# Patient Record
Sex: Male | Born: 1995 | Race: Black or African American | Hispanic: No | Marital: Single | Smoking: Never smoker
Health system: Southern US, Community
[De-identification: ages and names within clinical notes are randomized; demographics above are authoritative.]

## PROBLEM LIST (undated history)

## (undated) DIAGNOSIS — J45909 Unspecified asthma, uncomplicated: Secondary | ICD-10-CM

## (undated) DIAGNOSIS — T7840XA Allergy, unspecified, initial encounter: Secondary | ICD-10-CM

## (undated) HISTORY — DX: Allergy, unspecified, initial encounter: T78.40XA

## (undated) HISTORY — PX: NO PAST SURGERIES: SHX2092

---
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
## 2017-01-23 ENCOUNTER — Ambulatory Visit: Payer: Managed Care, Other (non HMO) | Admitting: Physician Assistant

## 2017-01-23 ENCOUNTER — Encounter: Payer: Self-pay | Admitting: Physician Assistant

## 2017-01-23 ENCOUNTER — Telehealth: Payer: Self-pay

## 2017-01-23 ENCOUNTER — Other Ambulatory Visit: Payer: Self-pay

## 2017-01-23 VITALS — BP 128/80 | HR 91 | Temp 98.1°F | Resp 18 | Ht 70.0 in | Wt 181.0 lb

## 2017-01-23 DIAGNOSIS — J069 Acute upper respiratory infection, unspecified: Secondary | ICD-10-CM

## 2017-01-23 DIAGNOSIS — Z91011 Allergy to milk products: Secondary | ICD-10-CM

## 2017-01-23 MED ORDER — HYDROCOD POLST-CPM POLST ER 10-8 MG/5ML PO SUER: 5.0000 mL | Freq: Every evening | ORAL | 0 refills | Status: DC | PRN

## 2017-01-23 MED ORDER — EPINEPHRINE 0.3 MG/0.3ML IJ SOAJ: 0.3000 mg | Freq: Once | INTRAMUSCULAR | 99 refills | Status: DC

## 2017-01-23 MED ORDER — BENZONATATE 100 MG PO CAPS: 100.0000 mg | ORAL_CAPSULE | Freq: Three times a day (TID) | ORAL | 0 refills | Status: DC | PRN

## 2017-01-23 MED ORDER — AZELASTINE HCL 0.1 % NA SOLN: 2.0000 | Freq: Two times a day (BID) | NASAL | 0 refills | Status: DC

## 2017-01-23 NOTE — Telephone Encounter (Signed)
Called in rx for epipen.

## 2017-01-23 NOTE — Patient Instructions (Addendum)
Get plenty of SLEEP! Drink lots of WATER!    IF you received an x-ray today, you will receive an [facility] from [facility]. Please contact [facility] at [phone] with questions or concerns regarding your invoice.   IF you received labwork today, you will receive an invoice from [facility]. Please contact [facility] at [phone] with questions or concerns regarding your invoice.   Our billing staff will not be able to assist you with questions regarding bills from these companies.  You will be contacted with the lab results as soon as they are available. The fastest way to get your results is to activate your My Chart account. Instructions are located on the last page of this paperwork. If you have not heard from us regarding the results in 2 weeks, please contact this office.

## 2017-01-23 NOTE — Progress Notes (Signed)
Patient ID: Bradley Maynard, male    DOB: Jan 30, 1996, 21 y.o.   MRN: [id number]  PCP: Patient, No Pcp Per  Chief Complaint  Patient presents with  . Cough    x2 weeks, pt states Bradley Maynard has a cough and runny nose and headaches. Pt states his chest hurts from coughing so much.  Pt states Bradley Maynard has been taking OTC Mucinex and NiQuil. Pt states Bradley Maynard took the Niquil early this mornuing.    Subjective:   Presents for evaluation of cough x 2 weeks. Bradley Maynard is accompanied by his mother.  His symptoms began with runny nose headache chest pain with coughing.  Cough produces clear phlegm.  Chest and head pain rated 6 out of 10 at worst. 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  Works 5 days a week at one job and 3 at the other.  Very long days.  His mom notes that Bradley Maynard prioritizes sleep during his off hours.  OTC Mucinex and NyQuil have helped but not alleviated his symptoms.  Bradley Maynard needs a new prescription for EpiPen due to milk/dairy allergy.    Review of Systems Constitutional: Positive for fever (tactile). Negative for appetite change.  HENT: Positive for congestion and rhinorrhea. Negative for ear pain, sinus pressure, sinus pain and sore throat.   Respiratory: Positive for cough and shortness of breath (with coughing).   Cardiovascular: Positive for chest pain (with coughing).  Gastrointestinal: Negative for abdominal pain, constipation, diarrhea, nausea and vomiting.  Genitourinary: Negative for dysuria.  Musculoskeletal: Positive for myalgias (shoulder pain with working).  Neurological: Positive for light-headedness (with lifting heavy objects at work. This past week only) and headaches.       There are no active problems to display for this patient.    Prior to Admission medications   Medication Sig Start Date End Date Taking? Authorizing Provider  EPINEPHrine (EPIPEN) 0.3 mg/0.3 mL IJ SOAJ injection Inject 0.3 mLs (0.3 mg total) into the muscle once. 09/13/13  Yes Tonye Pearsonoolittle,  Robert P, MD     Allergies  Allergen Reactions  . Milk-Related Compounds Anaphylaxis       Objective:  Physical Exam  Constitutional: Bradley Maynard is oriented to person, place, and time. Bradley Maynard appears well-developed and well-nourished. Bradley Maynard is active and cooperative. No distress.  BP 128/80 (BP Location: Right Arm, Patient Position: Sitting, Cuff Size: Normal)   Pulse 91   Temp 98.1 F (36.7 C) (Oral)   Resp 18   Ht 5\' 10"  (1.778 m)   Wt 181 lb (82.1 kg)   SpO2 98%   BMI 25.97 kg/m   HENT:  Head: Normocephalic and atraumatic.  Right Ear: Hearing and external ear normal.  Left Ear: Hearing and external ear normal.  Nose: Rhinorrhea (clear) present. No mucosal edema. Right sinus exhibits no maxillary sinus tenderness and no frontal sinus tenderness. Left sinus exhibits no maxillary sinus tenderness and no frontal sinus tenderness.  Mouth/Throat: Oropharynx is clear and moist. No oropharyngeal exudate.  Eyes: Conjunctivae and EOM are normal. Pupils are equal, round, and reactive to light. No scleral icterus.  Neck: Normal range of motion. Neck supple. No thyromegaly present.  Cardiovascular: Normal rate, regular rhythm and normal heart sounds.  Pulses:      Radial pulses are 2+ on the right side, and 2+ on the left side.  Pulmonary/Chest: Effort normal and breath sounds normal.  Lymphadenopathy:       Head (right side): No tonsillar, no preauricular, no  posterior auricular and no occipital adenopathy present.       Head (left side): No tonsillar, no preauricular, no posterior auricular and no occipital adenopathy present.    Bradley Maynard has no cervical adenopathy.       Right: No supraclavicular adenopathy present.       Left: No supraclavicular adenopathy present.  Neurological: Bradley Maynard is alert and oriented to person, place, and time. No sensory deficit.  Skin: Skin is warm, dry and intact. No rash noted. No cyanosis or erythema. Nails show no clubbing.  Psychiatric: Bradley Maynard has a normal mood and affect. His  speech is normal and behavior is normal.           Assessment & Plan:   1. Acute upper respiratory infection Likely viral.  Supportive care.  Rest, hydrate, OTC NSAIDs. - azelastine (ASTELIN) 0.1 % nasal spray; Place 2 sprays into both nostrils 2 (two) times daily. Use in each nostril as directed  Dispense: 30 mL; Refill: 0 - chlorpheniramine-HYDROcodone (TUSSIONEX PENNKINETIC ER) 10-8 MG/5ML SUER; Take 5 mLs by mouth at bedtime as needed for cough.  Dispense: 60 mL; Refill: 0 - benzonatate (TESSALON) 100 MG capsule; Take 1-2 capsules (100-200 mg total) by mouth 3 (three) times daily as needed for cough.  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    Return if symptoms worsen or fail to improve.   Fernande Brashelle S. Anntoinette Haefele, PA-C [facility] at [facility]

## 2017-01-23 NOTE — Progress Notes (Signed)
Chief Complaint  Patient presents with  . Cough    x2 weeks, pt states he has a cough and runny nose and headaches. Pt states his chest hurts from coughing so much.  Pt states he has been taking OTC Mucinex and NiQuil. Pt states he took the Niquil early this mornuing.   Subjective:    Patient ID: Bradley Maynard, male    DOB: 1995-08-07, 21 y.o.   MRN: [id number]  HPI Mr. Bradley Maynard is a 21 year old male that presents for evaluation of upper respiratory symptoms that have been going going on for two weeks.  Patient has a cough, runny nose, chest pain with coughing and headaches. Patient has clear phlegm with coughing. 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 Pain is 6/10 at its worst and is throughout his forehead. Mucous from nose is clear as well.  His coworkers have been having similar symptoms. He has tried taking OTC Mucinex and NyQuil which have been helping but haven't cleared his symptoms.  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  Review of Systems  Constitutional: Positive for fever (tactile). Negative for appetite change.  HENT: Positive for congestion and rhinorrhea. Negative for ear pain, sinus pressure, sinus pain and sore throat.   Respiratory: Positive for cough and shortness of breath (with coughing).   Cardiovascular: Positive for chest pain (with coughing).  Gastrointestinal: Negative for abdominal pain, constipation, diarrhea, nausea and vomiting.  Genitourinary: Negative for dysuria.  Musculoskeletal: Positive for myalgias (shoulder pain with working).  Neurological: Positive for light-headedness (with lifting heavy objects at work. This past week only) and headaches.   Past Medical History:  Diagnosis Date  . Allergy    No current outpatient medications on file prior to visit.   No current facility-administered medications on file prior to visit.     Allergies  Allergen Reactions  . Milk-Related Compounds Anaphylaxis   Social History   Socioeconomic History  . Marital status: Single    Spouse name: Not on file  . Number of children: Not on file  . Years of education: Not on file  . Highest education level: Not on file  Social Needs  . Financial resource strain: Not on file  . Food insecurity - worry: Not on file  . Food insecurity - inability: Not on file  . Transportation needs - medical: Not on file  . Transportation needs - non-medical: Not on file  Occupational History  . Not on file  Tobacco Use  . Smoking status: Never Smoker  . Smokeless tobacco: Never Used  Substance and Sexual Activity  . Alcohol use: No  . Drug use: No  . Sexual activity: Not on file  Other Topics Concern  . Not on file  Social History Narrative  . Not on file      Objective:   Physical Exam  Constitutional: He appears well-developed and well-nourished. No distress.  HENT:  Head: Normocephalic and atraumatic.  Right Ear: External ear normal.  Left Ear: External ear normal.  Nose: Nose normal.  Mouth/Throat: Oropharynx is clear and moist. No oropharyngeal exudate.  Clear mucus present in nose  Eyes: Conjunctivae are normal.  Neck: Neck supple.  Cardiovascular: Normal rate, regular rhythm and normal heart sounds.  Pulmonary/Chest: Effort normal and breath sounds normal. No respiratory distress.  Lymphadenopathy:    He has no cervical adenopathy.  Neurological: He is alert.  Skin: Skin is warm.  Psychiatric: He has a normal mood and affect.      Assessment & Plan:  1. Acute upper respiratory infection Discussed importance of sleep and hydration. - azelastine (ASTELIN) 0.1 % nasal spray; Place 2 sprays into both nostrils 2 (two) times daily. Use in each nostril as directed  Dispense: 30 mL; Refill: 0 - chlorpheniramine-HYDROcodone (TUSSIONEX PENNKINETIC ER) 10-8 MG/5ML SUER; Take 5 mLs by mouth at bedtime as needed for cough.   Dispense: 60 mL; Refill: 0 - benzonatate (TESSALON) 100 MG capsule; Take 1-2 capsules (100-200 mg total) by mouth 3 (three) times daily as needed for cough.  Dispense: 40 capsule; Refill: 0  2. Milk allergy Epipen re-ordered per patient request. - EPINEPHrine (EPIPEN) 0.3 mg/0.3 mL IJ SOAJ injection; Inject 0.3 mLs (0.3 mg total) into the muscle once for 1 dose.  Dispense: 2 Device; Refill: PRN  Cherylann RatelFaithcaren W Diyana Starrett, Student-PA

## 2017-01-25 ENCOUNTER — Telehealth: Payer: Self-pay | Admitting: Physician Assistant

## 2017-01-25 DIAGNOSIS — J069 Acute upper respiratory infection, unspecified: Secondary | ICD-10-CM

## 2017-01-25 NOTE — Telephone Encounter (Signed)
Please advise 

## 2017-01-25 NOTE — Telephone Encounter (Signed)
Copied from CRM [phone]. Topic: Quick Communication - See Telephone Encounter >> Jan 25, 2017  9:24 AM Rudi CocoLathan, Rayneisha Bouza M, NT wrote: CRM for notification. See Telephone encounter for:   01/25/17. Pt. Mother called Patriciaann Clan(Monica [facility]adi) and said pharmacy did not give pt. The entire amount of cough syrup and pt. Is already out and needs more. Calling to see if he can get more. Pt. Still in pain with sore throat. (chlorpheniramine Hydrocodone)  [facility] [address] - [place] ([place]), [place] - [address] [place]) [place] [id number] Phone: [phone] Fax: [phone] Not a 24 hour pharmacy; exact hours not known

## 2017-01-25 NOTE — Telephone Encounter (Signed)
Please call the pharmacy. How much did they dispense?

## 2017-01-30 MED ORDER — HYDROCOD POLST-CPM POLST ER 10-8 MG/5ML PO SUER: 5.0000 mL | Freq: Every evening | ORAL | 0 refills | Status: DC | PRN

## 2017-01-30 NOTE — Telephone Encounter (Signed)
Rx sent electronically.  Meds ordered this encounter  Medications  . chlorpheniramine-HYDROcodone (TUSSIONEX PENNKINETIC ER) 10-8 MG/5ML SUER    Sig: Take 5 mLs by mouth at bedtime as needed for cough.    Dispense:  35 mL    Refill:  0    Order Specific Question:   Supervising Provider    Answer:   Clelia CroftSHAW, EVA N [[id number]]

## 2017-01-30 NOTE — Telephone Encounter (Signed)
[facility] states they're going by new guidelines and the pt was only given 35 ml because it is hydrocodone. [facility] states that the new guidelines states pts can only get 7 days worth at a time. We can send a new Rx.

## 2017-01-30 NOTE — Telephone Encounter (Signed)
Pt mom is aware waiting on someone to contact pharm

## 2017-02-04 ENCOUNTER — Ambulatory Visit: Payer: Managed Care, Other (non HMO) | Admitting: Family Medicine

## 2017-02-04 ENCOUNTER — Other Ambulatory Visit: Payer: Self-pay

## 2017-02-04 ENCOUNTER — Encounter: Payer: Self-pay | Admitting: Family Medicine

## 2017-02-04 VITALS — BP 112/78 | HR 111 | Temp 98.7°F | Resp 16 | Ht 70.0 in | Wt 181.8 lb

## 2017-02-04 DIAGNOSIS — J208 Acute bronchitis due to other specified organisms: Secondary | ICD-10-CM | POA: Diagnosis not present

## 2017-02-04 DIAGNOSIS — J069 Acute upper respiratory infection, unspecified: Secondary | ICD-10-CM

## 2017-02-04 MED ORDER — PREDNISONE 10 MG PO TABS: ORAL_TABLET | ORAL | 0 refills | Status: AC

## 2017-02-04 MED ORDER — AZITHROMYCIN 250 MG PO TABS: ORAL_TABLET | ORAL | 0 refills | Status: DC

## 2017-02-04 MED ORDER — ALBUTEROL SULFATE HFA 108 (90 BASE) MCG/ACT IN AERS: 2.0000 | INHALATION_SPRAY | Freq: Four times a day (QID) | RESPIRATORY_TRACT | 0 refills | Status: DC | PRN

## 2017-02-04 NOTE — Patient Instructions (Addendum)

## 2017-02-04 NOTE — Progress Notes (Signed)
Chief Complaint  Patient presents with  . Establish Care    cough x 2 weeks, taking cough syrup given but still has cough, cough a little better than before    HPI   Pt has been coughing for 2 weeks He has been taking cough syrups but is still coughing though his cough has improved compared to previous He was seen on 01/23/17 He is back because now he is wheezing with shortness of breath He denies fevers currently He has been feeling feverish but not in the past 2-3 days He is home from college on break He started getting sick after finals He denies nausea vomiting or diarrhea  4 review of systems  Past Medical History:  Diagnosis Date  . Allergy     Current Outpatient Medications  Medication Sig Dispense Refill  . azelastine (ASTELIN) 0.1 % nasal spray Place 2 sprays into both nostrils 2 (two) times daily. Use in each nostril as directed 30 mL 0  . benzonatate (TESSALON) 100 MG capsule Take 1-2 capsules (100-200 mg total) by mouth 3 (three) times daily as needed for cough. 40 capsule 0  . chlorpheniramine-HYDROcodone (TUSSIONEX PENNKINETIC ER) 10-8 MG/5ML SUER Take 5 mLs by mouth at bedtime as needed for cough. 35 mL 0  . albuterol (PROVENTIL HFA;VENTOLIN HFA) 108 (90 Base) MCG/ACT inhaler Inhale 2 puffs into the lungs every 6 (six) hours as needed for wheezing. 1 Inhaler 0  . azithromycin (ZITHROMAX) 250 MG tablet Take 2 tabs on day one then 1 tablet each day after 6 tablet 0  . EPINEPHrine (EPIPEN) 0.3 mg/0.3 mL IJ SOAJ injection Inject 0.3 mLs (0.3 mg total) into the muscle once for 1 dose. 2 Device PRN  . predniSONE (DELTASONE) 10 MG tablet Take 6 tablets (60 mg total) by mouth daily with breakfast for 2 days, THEN 5 tablets (50 mg total) daily with breakfast for 2 days, THEN 4 tablets (40 mg total) daily with breakfast for 2 days, THEN 3 tablets (30 mg total) daily with breakfast for 2 days, THEN 2 tablets (20 mg total) daily with breakfast for 2 days, THEN 1 tablet (10 mg  total) daily with breakfast for 2 days. 42 tablet 0   No current facility-administered medications for this visit.     Allergies:  Allergies  Allergen Reactions  . Milk-Related Compounds Anaphylaxis    No past surgical history on file.  Social History   Socioeconomic History  . Marital status: Single    Spouse name: None  . Number of children: None  . Years of education: None  . Highest education level: None  Social Needs  . Financial resource strain: None  . Food insecurity - worry: None  . Food insecurity - inability: None  . Transportation needs - medical: None  . Transportation needs - non-medical: None  Occupational History  . None  Tobacco Use  . Smoking status: Never Smoker  . Smokeless tobacco: Never Used  Substance and Sexual Activity  . Alcohol use: No  . Drug use: No  . Sexual activity: None  Other Topics Concern  . None  Social History Narrative  . None    Family History  Problem Relation Age of Onset  . Diabetes Mother   . Hypertension Mother   . Hypertension Father      ROS Review of Systems See HPI Constitution: +fevers No malaise No diaphoresis Skin: No rash or itching Eyes: no blurry vision, no double vision GU: no dysuria or hematuria Neuro: no  dizziness or headaches all others reviewed and negative   Objective: Vitals:   02/04/17 1203  BP: 112/78  Pulse: (!) 111  Resp: 16  Temp: 98.7 F (37.1 C)  TempSrc: Oral  SpO2: 96%  Weight: 181 lb 12.8 oz (82.5 kg)  Height: 5\' 10"  (1.778 m)    Physical Exam General: alert, oriented, in NAD Head: normocephalic, atraumatic, no sinus tenderness Eyes: EOM intact, no scleral icterus or conjunctival injection Ears: TM clear bilaterally Nose: mucosa nonerythematous, nonedematous Throat: no pharyngeal exudate or erythema Lymph: no posterior auricular, submental or cervical lymph adenopathy Heart: normal rate, normal sinus rhythm, no murmurs Lungs: wheezing in anterior lung  fields   Assessment and Plan Kowen was seen today for establish care.  Diagnoses and all orders for this visit:  Acute upper respiratory infection Acute bronchitis due to other specified organisms- with deterioration of symptoms Advised antibiotic, albuterol and prednisone Pt to follow up for reassessment if he is still wheezing He is here with his mother who agrees with the plan  Other orders -     Cancel: Tdap vaccine greater than or equal to 7yo IM -     predniSONE (DELTASONE) 10 MG tablet; Take 6 tablets (60 mg total) by mouth daily with breakfast for 2 days, THEN 5 tablets (50 mg total) daily with breakfast for 2 days, THEN 4 tablets (40 mg total) daily with breakfast for 2 days, THEN 3 tablets (30 mg total) daily with breakfast for 2 days, THEN 2 tablets (20 mg total) daily with breakfast for 2 days, THEN 1 tablet (10 mg total) daily with breakfast for 2 days. -     albuterol (PROVENTIL HFA;VENTOLIN HFA) 108 (90 Base) MCG/ACT inhaler; Inhale 2 puffs into the lungs every 6 (six) hours as needed for wheezing. -     azithromycin (ZITHROMAX) 250 MG tablet; Take 2 tabs on day one then 1 tablet each day after     Lariya Kinzie A Creta LevinStallings

## 2018-01-11 ENCOUNTER — Ambulatory Visit: Payer: Managed Care, Other (non HMO) | Admitting: Family Medicine

## 2018-01-13 ENCOUNTER — Ambulatory Visit: Payer: Self-pay | Admitting: Physician Assistant

## 2018-02-10 ENCOUNTER — Encounter

## 2018-02-10 ENCOUNTER — Ambulatory Visit (INDEPENDENT_AMBULATORY_CARE_PROVIDER_SITE_OTHER): Payer: Managed Care, Other (non HMO) | Admitting: Family Medicine

## 2018-02-10 ENCOUNTER — Encounter: Payer: Self-pay | Admitting: Family Medicine

## 2018-02-10 ENCOUNTER — Ambulatory Visit
Admission: RE | Admit: 2018-02-10 | Discharge: 2018-02-10 | Disposition: A | Discharge status: Home / Self Care | Payer: Managed Care, Other (non HMO) | Source: Ambulatory Visit | Attending: Family Medicine | Admitting: Family Medicine

## 2018-02-10 ENCOUNTER — Other Ambulatory Visit: Payer: Self-pay

## 2018-02-10 VITALS — BP 140/70 | HR 72 | Temp 98.7°F | Resp 16 | Ht 69.0 in | Wt 188.2 lb

## 2018-02-10 DIAGNOSIS — Z91011 Allergy to milk products: Secondary | ICD-10-CM | POA: Diagnosis not present

## 2018-02-10 DIAGNOSIS — R05 Cough: Secondary | ICD-10-CM

## 2018-02-10 DIAGNOSIS — J309 Allergic rhinitis, unspecified: Secondary | ICD-10-CM | POA: Diagnosis not present

## 2018-02-10 DIAGNOSIS — R059 Cough, unspecified: Secondary | ICD-10-CM

## 2018-02-10 DIAGNOSIS — R062 Wheezing: Secondary | ICD-10-CM | POA: Diagnosis not present

## 2018-02-10 MED ORDER — EPINEPHRINE 0.3 MG/0.3ML IJ SOAJ: 0.3000 mg | Freq: Once | INTRAMUSCULAR | 99 refills | Status: DC

## 2018-02-10 MED ORDER — GUAIFENESIN ER 600 MG PO TB12: 600.0000 mg | ORAL_TABLET | Freq: Two times a day (BID) | ORAL | 11 refills | Status: DC
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
## 2018-02-10 NOTE — Patient Instructions (Addendum)
Chest XRAY today  Use a respirator mask for 3 day at work to see if there is less congestion Layer up to be able to transition from outdoor environment to inside environment   [facility] [phone]   If you have lab work done today you will be contacted with your lab results within the next 2 weeks.  If you have not heard from us then please contact us. The fastest way to get your results is to register for My Chart.   IF you received an x-ray today, you will receive an invoice from [facility]. Please contact [facility] at [phone] with questions or concerns regarding your invoice.   IF you received labwork today, you will receive an invoice from [facility]. Please contact [facility] at [phone] with questions or concerns regarding your invoice.   Our billing staff will not be able to assist you with questions regarding bills from these companies.  You will be contacted with the lab results as soon as they are available. The fastest way to get your results is to activate your My Chart account. Instructions are located on the last page of this paperwork. If you have not heard from us regarding the results in 2 weeks, please contact this office.     Allergic Rhinitis, Adult Allergic rhinitis is an allergic reaction that affects the mucous membrane inside the nose. It causes sneezing, a runny or stuffy nose, and the feeling of mucus going down the back of the throat (postnasal drip). Allergic rhinitis can be mild to severe. There are two types of allergic rhinitis:  Seasonal. This type is also called hay fever. It happens only during certain seasons.  Perennial. This type can happen at any time of the year. What are the causes? This condition happens when the body's defense system (immune system) responds to certain harmless substances called allergens as though they were germs.  Seasonal allergic rhinitis is triggered by pollen, which can come from  grasses, trees, and weeds. Perennial allergic rhinitis may be caused by:  House dust mites.  Pet dander.  Mold spores. What are the signs or symptoms? Symptoms of this condition include:  Sneezing.  Runny or stuffy nose (nasal congestion).  Postnasal drip.  Itchy nose.  Tearing of the eyes.  Trouble sleeping.  Daytime sleepiness. How is this diagnosed? This condition may be diagnosed based on:  Your medical history.  A physical exam.  Tests to check for related conditions, such as: ? Asthma. ? Pink eye. ? Ear infection. ? Upper respiratory infection.  Tests to find out which allergens trigger your symptoms. These may include skin or blood tests. How is this treated? There is no cure for this condition, but treatment can help control symptoms. Treatment may include:  Taking medicines that block allergy symptoms, such as antihistamines. Medicine may be given as a shot, nasal spray, or pill.  Avoiding the allergen.  Desensitization. This treatment involves getting ongoing shots until your body becomes less sensitive to the allergen. This treatment may be done if other treatments do not help.  If taking medicine and avoiding the allergen does not work, new, stronger medicines may be prescribed. Follow these instructions at home:  Find out what you are allergic to. Common allergens include smoke, dust, and pollen.  Avoid the things you are allergic to. These are some things you can do to help avoid allergens: ? Replace carpet with wood, tile, or vinyl flooring. Carpet can trap dander and dust. ? Do  not smoke. Do not allow smoking in your home. ? Change your heating and air conditioning filter at least once a month. ? During allergy season:  Keep windows closed as much as possible.  Plan outdoor activities when pollen counts are lowest. This is usually during the evening hours.  When coming indoors, change clothing and shower before sitting on furniture or  bedding.  Take over-the-counter and prescription medicines only as told by your health care provider.  Keep all follow-up visits as told by your health care provider. This is important. Contact a health care provider if:  You have a fever.  You develop a persistent cough.  You make whistling sounds when you breathe (you wheeze).  Your symptoms interfere with your normal daily activities. Get help right away if:  You have shortness of breath. Summary  This condition can be managed by taking medicines as directed and avoiding allergens.  Contact your health care provider if you develop a persistent cough or fever.  During allergy season, keep windows closed as much as possible. This information is not intended to replace advice given to you by your health care provider. Make sure you discuss any questions you have with your health care provider. Document Released: 10/31/2000 Document Revised: 03/15/2016 Document Reviewed: 03/15/2016 Elsevier Interactive Patient Education  2019 [employer]

## 2018-06-05 ENCOUNTER — Ambulatory Visit (INDEPENDENT_AMBULATORY_CARE_PROVIDER_SITE_OTHER): Payer: Managed Care, Other (non HMO)

## 2018-06-05 ENCOUNTER — Ambulatory Visit
Admission: EM | Admit: 2018-06-05 | Discharge: 2018-06-05 | Disposition: A | Discharge status: Home / Self Care | Payer: Managed Care, Other (non HMO) | Attending: Family Medicine | Admitting: Family Medicine

## 2018-06-05 ENCOUNTER — Other Ambulatory Visit: Payer: Self-pay

## 2018-06-05 DIAGNOSIS — R05 Cough: Secondary | ICD-10-CM

## 2018-06-05 DIAGNOSIS — R0602 Shortness of breath: Secondary | ICD-10-CM

## 2018-06-05 DIAGNOSIS — J4521 Mild intermittent asthma with (acute) exacerbation: Secondary | ICD-10-CM | POA: Diagnosis not present

## 2018-06-05 HISTORY — DX: Unspecified asthma, uncomplicated: J45.909

## 2018-06-05 MED ORDER — ALBUTEROL SULFATE HFA 108 (90 BASE) MCG/ACT IN AERS: 2.0000 | INHALATION_SPRAY | Freq: Four times a day (QID) | RESPIRATORY_TRACT | 3 refills | Status: DC | PRN

## 2018-06-05 MED ORDER — PREDNISONE 50 MG PO TABS: ORAL_TABLET | ORAL | 0 refills | Status: DC

## 2018-06-05 NOTE — ED Provider Notes (Signed)
[facility]  CSN: [id number] Arrival date & time: 06/05/18  [id number]  History   Chief Complaint Chief Complaint  Patient presents with  . Asthma   HPI  23 year old male presents with cough and shortness of breath.  Patient reports a one-month history of cough.  Mostly dry but does produce some sputum intermittently.  Clear sputum.  No documented fever at home.  Patient reports that he feels short of breath particular with exertion.  Patient states that he has asthma and does not have a current prescription for albuterol.  His symptoms have persisted and have not improved over the past month.  He thinks that this may be exacerbated by his job.  He works in a [employer]house at [employer].  He states it is very dusty.  Reports some associated chest tightness, 5/10 in severity.  No other associated symptoms.  No other complaints or concerns at this time.  PMH, Surgical Hx, Family Hx, Social History reviewed and updated as below.  Past Medical History:  Diagnosis Date  . Allergy   . Asthma    Past Surgical History:  Procedure Laterality Date  . NO PAST SURGERIES     Home Medications    Prior to Admission medications   Medication Sig Start Date End Date Taking? Authorizing Provider  EPINEPHrine (EPIPEN) 0.3 mg/0.3 mL IJ SOAJ injection Inject 0.3 mLs (0.3 mg total) into the muscle once for 1 dose. 02/10/18 06/05/18 Yes Stallings, Manus RuddZoe A, MD  albuterol (PROVENTIL HFA;VENTOLIN HFA) 108 (90 Base) MCG/ACT inhaler Inhale 2 puffs into the lungs every 6 (six) hours as needed for wheezing or shortness of breath. 06/05/18   Tommie Samsook, Andrw Mcguirt G, DO  predniSONE (DELTASONE) 50 MG tablet 1 tablet daily x 5 days 06/05/18   Tommie Samsook, Apolonia Ellwood G, DO    Family History Family History  Problem Relation Age of Onset  . Diabetes Mother   . Hypertension Mother   . Hypertension Father     Social History Social History   Tobacco Use  . Smoking status: Never Smoker  . Smokeless tobacco: Never Used  Substance Use Topics   . Alcohol use: No  . Drug use: No     Allergies   Milk-related compounds   Review of Systems Review of Systems  Constitutional: Negative for fever.  Respiratory: Positive for cough, chest tightness and shortness of breath.    Physical Exam Triage Vital Signs ED Triage Vitals  Enc Vitals Group     BP 06/05/18 0959 (!) 156/103     Pulse Rate 06/05/18 0959 91     Resp 06/05/18 0959 19     Temp 06/05/18 0959 99.4 F (37.4 C)     Temp Source 06/05/18 0959 Oral     SpO2 06/05/18 0959 92 %     Weight 06/05/18 0955 185 lb (83.9 kg)     Height 06/05/18 0955 5\' 9"  (1.753 m)     Head Circumference --      Peak Flow --      Pain Score 06/05/18 0955 5     Pain Loc --      Pain Edu? --      Excl. in GC? --    Updated Vital Signs BP (!) 156/103 (BP Location: Left Arm)   Pulse 91   Temp 99.4 F (37.4 C) (Oral)   Resp 19   Ht 5\' 9"  (1.753 m)   Wt 83.9 kg   SpO2 92%   BMI 27.32 kg/m   Visual Acuity  Right Eye Distance:   Left Eye Distance:   Bilateral Distance:    Right Eye Near:   Left Eye Near:    Bilateral Near:     Physical Exam Vitals signs and nursing note reviewed.  Constitutional:      General: He is not in acute distress.    Appearance: Normal appearance.  HENT:     Head: Normocephalic and atraumatic.  Eyes:     General:        Right eye: No discharge.        Left eye: No discharge.     Conjunctiva/sclera: Conjunctivae normal.  Cardiovascular:     Rate and Rhythm: Normal rate and regular rhythm.  Pulmonary:     Effort: Pulmonary effort is normal. No respiratory distress.     Comments: Diffuse expiratory wheezing. Neurological:     Mental Status: He is alert.  Psychiatric:        Mood and Affect: Mood normal.        Behavior: Behavior normal.    UC Treatments / Results  Labs (all labs ordered are listed, but only abnormal results are displayed) Labs Reviewed - No data to display  EKG None  Radiology Dg Chest 2 View  Result Date:  06/05/2018 CLINICAL DATA:  Cough and shortness of breath EXAM: CHEST - 2 VIEW COMPARISON:  February 10, 2018 FINDINGS: Lungs are clear. Heart size and pulmonary vascularity are normal. No adenopathy. No bone lesions. IMPRESSION: No edema or consolidation. Electronically Signed   By: Bretta Bang III M.D.   On: 06/05/2018 10:37    Procedures Procedures (including critical care time)  Medications Ordered in UC Medications - No data to display  Initial Impression / Assessment and Plan / UC Course  I have reviewed the triage vital signs and the nursing notes.  Pertinent labs & imaging results that were available during my care of the patient were reviewed by me and considered in my medical decision making (see chart for details).    23 year old male presents with cough, shortness of breath.  Exam significant for expiratory wheezing.  X-ray negative.  Patient appears to be experiencing an asthma exacerbation.  Treating with albuterol and prednisone.  Final Clinical Impressions(s) / UC Diagnoses   Final diagnoses:  Mild intermittent asthma with exacerbation     Discharge Instructions     Xray negative.  Medications as prescribed.  Take care  Dr. Adriana Simas     ED Prescriptions    Medication Sig Dispense Auth. Provider   albuterol (PROVENTIL HFA;VENTOLIN HFA) 108 (90 Base) MCG/ACT inhaler Inhale 2 puffs into the lungs every 6 (six) hours as needed for wheezing or shortness of breath. 18 g Dejaun Vidrio G, DO   predniSONE (DELTASONE) 50 MG tablet 1 tablet daily x 5 days 5 tablet Tommie Sams, DO     Controlled Substance Prescriptions [place] Controlled Substance Registry consulted? Not Applicable   Tommie Sams, DO 06/05/18 1048

## 2018-06-05 NOTE — Discharge Instructions (Signed)
Xray negative.  Medications as prescribed.  Take care  Dr. Reza Crymes  

## 2018-06-05 NOTE — ED Triage Notes (Signed)
Patient states that he is here for a refill of his albuterol inhaler. States that he asthma and wheezing. Patient reports that his symptoms have been constant x 1 month.

## 2018-09-25 ENCOUNTER — Other Ambulatory Visit: Payer: Self-pay | Admitting: Family Medicine

## 2018-10-05 ENCOUNTER — Other Ambulatory Visit: Payer: Self-pay | Admitting: Family Medicine

## 2018-10-21 ENCOUNTER — Other Ambulatory Visit: Payer: Self-pay | Admitting: Family Medicine

## 2018-10-23 ENCOUNTER — Encounter: Payer: Self-pay | Admitting: Family Medicine

## 2018-10-23 ENCOUNTER — Ambulatory Visit (INDEPENDENT_AMBULATORY_CARE_PROVIDER_SITE_OTHER): Payer: Managed Care, Other (non HMO) | Admitting: Family Medicine

## 2018-10-23 ENCOUNTER — Other Ambulatory Visit: Payer: Self-pay

## 2018-10-23 VITALS — BP 122/86 | HR 92 | Temp 99.5°F | Resp 17 | Ht 69.0 in | Wt 190.0 lb

## 2018-10-23 DIAGNOSIS — Z299 Encounter for prophylactic measures, unspecified: Secondary | ICD-10-CM | POA: Diagnosis not present

## 2018-10-23 DIAGNOSIS — Z23 Encounter for immunization: Secondary | ICD-10-CM | POA: Diagnosis not present

## 2018-10-23 DIAGNOSIS — J4540 Moderate persistent asthma, uncomplicated: Secondary | ICD-10-CM

## 2018-10-23 DIAGNOSIS — Z91011 Allergy to milk products: Secondary | ICD-10-CM | POA: Diagnosis not present

## 2018-10-23 MED ORDER — EPINEPHRINE 0.3 MG/0.3ML IJ SOAJ: 0.3000 mg | Freq: Once | INTRAMUSCULAR | 0 refills | Status: AC

## 2018-10-23 MED ORDER — FLOVENT HFA 110 MCG/ACT IN AERO: 2.0000 | INHALATION_SPRAY | Freq: Two times a day (BID) | RESPIRATORY_TRACT | 6 refills | Status: DC

## 2018-10-23 MED ORDER — ALBUTEROL SULFATE HFA 108 (90 BASE) MCG/ACT IN AERS: 2.0000 | INHALATION_SPRAY | Freq: Four times a day (QID) | RESPIRATORY_TRACT | 3 refills | Status: AC | PRN

## 2018-10-23 NOTE — Patient Instructions (Addendum)
  Continue to use the albuterol inhaler 2 puffs 4 times daily if needed for wheezing.  Treat the lungs to try and keep them open and reduce the need for the albuterol inhaler.  This will be done with the Flovent (fluticasone) 110 inhaler.  Use 2 puffs twice a day.  If after 1 month you notice you are doing considerably better, you can try to reduce it to 1 puff twice daily.  Plan to return in 4 months or so for a follow-up visit with Dr. Nolon Rod.  Use the EpiPen if needed for a severe milk allergy reaction.  In the event of an acute severe asthma attack use the EpiPen.  In the event of worsening return sooner for a recheck, or if acutely difficult to breathe which is not opening up with your albuterol go to an emergency room or call 911.  If you have lab work done today you will be contacted with your lab results within the next 2 weeks.  If you have not heard from [place] then please contact us. The fastest way to get your results is to register for My Chart.   IF you received an x-ray today, you will receive an invoice from [facility]. Please contact [facility] at [phone] with questions or concerns regarding your invoice.   IF you received labwork today, you will receive an invoice from [facility]. Please contact [facility] at [phone] with questions or concerns regarding your invoice.   Our billing staff will not be able to assist you with questions regarding bills from these companies.  You will be contacted with the lab results as soon as they are available. The fastest way to get your results is to activate your My Chart account. Instructions are located on the last page of this paperwork. If you have not heard from [place] regarding the results in 2 weeks, please contact this office.

## 2018-10-23 NOTE — Progress Notes (Signed)
Patient ID: Bradley Maynard, male    DOB: 1995-12-25  Age: 23 y.o. MRN: [id number]  Chief Complaint  Patient presents with  . Medication Refill    ventolin hfa and epi pen    Subjective:   Patient has history of asthma for the last several years since high school.  He works at [employer] in [employer].  The dust there makes things worse.  He uses the albuterol inhaler 2 puffs about 3 or 4 times a day.  He has not been on steroid inhalers.  In the past he has had some bad allergic reactions to milk, and carries an EpiPen.  He needs refills on his medications.  He does not have any acute problems today.  Current allergies, medications, problem list, past/family and social histories reviewed.  Objective:  BP 122/86 (BP Location: Right Arm, Patient Position: Sitting, Cuff Size: Large)   Pulse 92   Temp 99.5 F (37.5 C) (Oral)   Resp 17   Ht 5\' 9"  (1.753 m)   Wt 190 lb (86.2 kg)   SpO2 96%   BMI 28.06 kg/m   Healthy-appearing young man.  He does have diffuse wheezing and rhonchi throughout both lungs.  Heart regular without murmur.  Assessment & Plan:   Assessment: 1. Need for prophylactic measure   2. Need for prophylactic vaccination and inoculation against influenza   3. Milk allergy   4. Moderate persistent chronic asthma without complication       Plan: See instructions.  Will add a steroid inhaler.  Might need a combination inhaler if this does not do the job.  No orders of the defined types were placed in this encounter.   No orders of the defined types were placed in this encounter.        Patient Instructions    Continue to use the albuterol inhaler 2 puffs 4 times daily if needed for wheezing.  Treat the lungs to try and keep them open and reduce the need for the albuterol inhaler.  This will be done with the Flovent (fluticasone) 110 inhaler.  Use 2 puffs twice a day.  If after 1 month you notice you are doing considerably better, you can try to reduce it  to 1 puff twice daily.  Plan to return in 4 months or so for a follow-up visit with Dr. Creta LevinStallings.  Use the EpiPen if needed for a severe milk allergy reaction.  In the event of an acute severe asthma attack use the EpiPen.  In the event of worsening return sooner for a recheck, or if acutely difficult to breathe which is not opening up with your albuterol go to an emergency room or call 911.  If you have lab work done today you will be contacted with your lab results within the next 2 weeks.  If you have not heard from us then please contact us. The fastest way to get your results is to register for My Chart.   IF you received an x-ray today, you will receive an invoice from [facility]. Please contact [facility] at [phone] with questions or concerns regarding your invoice.   IF you received labwork today, you will receive an invoice from [facility]. Please contact [facility] at [phone] with questions or concerns regarding your invoice.   Our billing staff will not be able to assist you with questions regarding bills from these companies.  You will be contacted with the lab results as soon as they are available. The fastest  way to get your results is to activate your My Chart account. Instructions are located on the last page of this paperwork. If you have not heard from [place] regarding the results in 2 weeks, please contact this office.        Return in about 4 weeks (around 11/20/2018), or Stallings, for asthma.   Ruben Reason, MD 10/23/2018

## 2018-11-21 ENCOUNTER — Ambulatory Visit (INDEPENDENT_AMBULATORY_CARE_PROVIDER_SITE_OTHER): Payer: Managed Care, Other (non HMO) | Admitting: Family Medicine

## 2018-11-21 ENCOUNTER — Other Ambulatory Visit: Payer: Self-pay

## 2018-11-21 ENCOUNTER — Encounter: Payer: Self-pay | Admitting: Family Medicine

## 2018-11-21 VITALS — BP 127/78 | HR 70 | Temp 99.1°F | Resp 17 | Ht 69.0 in | Wt 189.4 lb

## 2018-11-21 DIAGNOSIS — L2089 Other atopic dermatitis: Secondary | ICD-10-CM | POA: Diagnosis not present

## 2018-11-21 DIAGNOSIS — Z23 Encounter for immunization: Secondary | ICD-10-CM | POA: Diagnosis not present

## 2018-11-21 DIAGNOSIS — R062 Wheezing: Secondary | ICD-10-CM | POA: Diagnosis not present

## 2018-11-21 DIAGNOSIS — J4540 Moderate persistent asthma, uncomplicated: Secondary | ICD-10-CM | POA: Diagnosis not present

## 2018-11-21 MED ORDER — FLUTICASONE PROPIONATE HFA 110 MCG/ACT IN AERO: 1.0000 | INHALATION_SPRAY | Freq: Two times a day (BID) | RESPIRATORY_TRACT | 1 refills | Status: DC

## 2018-11-21 MED ORDER — MONTELUKAST SODIUM 10 MG PO TABS: 10.0000 mg | ORAL_TABLET | Freq: Every day | ORAL | 3 refills | Status: DC

## 2018-11-21 NOTE — Progress Notes (Signed)
Established Patient Office Visit  Subjective:  Patient ID: Bradley Maynard, male    DOB: August 31, 1995  Age: 23 y.o. MRN: [id number]  CC:  Chief Complaint  Patient presents with  . Asthma    4 week recheck.  Per pt asthma symptoms are about the same  . Medication Refill    albuterol    HPI Dunbar CANAAN HOLZER presents for   He was seen 10/23/2018 for asthma He reports that he uses his asthma inhaler three times a day He reports that he does not know his triggers. He has never had allergy testing.   He has a milk allergy, eczema and asthma He has never taken aspirin. He has never been to a pulmonologist He reports that his asthma is always with him He has not has any ER visits or hospitalizations There is not family history of asthma in the family.  He is only doing albuterol which costs $45 as his payment after insurance Flovent was $180 cost for him.  He does not use any other meds.    Past Medical History:  Diagnosis Date  . Allergy   . Asthma     Past Surgical History:  Procedure Laterality Date  . NO PAST SURGERIES      Family History  Problem Relation Age of Onset  . Diabetes Mother   . Hypertension Mother   . Hypertension Father     Social History   Socioeconomic History  . Marital status: Single    Spouse name: Not on file  . Number of children: Not on file  . Years of education: Not on file  . Highest education level: Not on file  Occupational History  . Not on file  Social Needs  . Financial resource strain: Not on file  . Food insecurity    Worry: Not on file    Inability: Not on file  . Transportation needs    Medical: Not on file    Non-medical: Not on file  Tobacco Use  . Smoking status: Never Smoker  . Smokeless tobacco: Never Used  Substance and Sexual Activity  . Alcohol use: No  . Drug use: No  . Sexual activity: Not on file  Lifestyle  . Physical activity    Days per week: Not on file    Minutes per session: Not on file  .  Stress: Not on file  Relationships  . Social Herbalist on phone: Not on file    Gets together: Not on file    Attends religious service: Not on file    Active member of club or organization: Not on file    Attends meetings of clubs or organizations: Not on file    Relationship status: Not on file  . Intimate partner violence    Fear of current or ex partner: Not on file    Emotionally abused: Not on file    Physically abused: Not on file    Forced sexual activity: Not on file  Other Topics Concern  . Not on file  Social History Narrative  . Not on file    Outpatient Medications Prior to Visit  Medication Sig Dispense Refill  . albuterol (VENTOLIN HFA) 108 (90 Base) MCG/ACT inhaler Inhale 2 puffs into the lungs every 6 (six) hours as needed for wheezing or shortness of breath. 18 g 3  . fluticasone (FLOVENT HFA) 110 MCG/ACT inhaler Inhale 2 puffs into the lungs 2 (two) times daily. 1 Inhaler 6  .  EPINEPHrine (EPIPEN) 0.3 mg/0.3 mL IJ SOAJ injection Inject 0.3 mLs (0.3 mg total) into the muscle once for 1 dose. 0.3 mL 0  . predniSONE (DELTASONE) 50 MG tablet 1 tablet daily x 5 days (Patient not taking: Reported on 10/23/2018) 5 tablet 0   No facility-administered medications prior to visit.     Allergies  Allergen Reactions  . Milk-Related Compounds Anaphylaxis    ROS Review of Systems Review of Systems  Constitutional: Negative for activity change, appetite change, chills and fever.  HENT: Negative for congestion, nosebleeds, trouble swallowing and voice change.   Respiratory: see hpi    Gastrointestinal: Negative for diarrhea, nausea and vomiting.  Genitourinary: Negative for difficulty urinating, dysuria, flank pain and hematuria.  Musculoskeletal: Negative for back pain, joint swelling and neck pain.  Neurological: Negative for dizziness, speech difficulty, light-headedness and numbness.  See HPI. All other review of systems negative.     Objective:     Physical Exam  BP 127/78 (BP Location: Left Arm, Patient Position: Sitting, Cuff Size: Large)   Pulse 70   Temp 99.1 F (37.3 C) (Oral)   Resp 17   Ht 5' 9"  (1.753 m)   Wt 189 lb 6.4 oz (85.9 kg)   SpO2 100%   BMI 27.97 kg/m  Wt Readings from Last 3 Encounters:  11/21/18 189 lb 6.4 oz (85.9 kg)  10/23/18 190 lb (86.2 kg)  06/05/18 185 lb (83.9 kg)    Physical Exam  Constitutional: Oriented to person, place, and time. Appears well-developed and well-nourished.  HENT:  Head: Normocephalic and atraumatic.  Eyes: Conjunctivae and EOM are normal.  Cardiovascular: Normal rate, regular rhythm, normal heart sounds and intact distal pulses.  No murmur heard. Pulmonary/Chest: End expiratory wheezing through, Normal excursion, No retraction Neurological: Is alert and oriented to person, place, and time.  Skin: Skin is warm. Capillary refill takes less than 2 seconds.  Psychiatric: Has a normal mood and affect. Behavior is normal. Judgment and thought content normal.      Health Maintenance Due  Topic Date Due  . HIV Screening  09/12/2011  . TETANUS/TDAP  09/12/2015  . INFLUENZA VACCINE  09/20/2018    There are no preventive care reminders to display for this patient.  No results found for: TSH Lab Results  Component Value Date   WBC 3.6 (L) 09/10/2014   HGB 15.0 09/10/2014   HCT 45.6 09/10/2014   MCV 82.9 09/10/2014   PLT 252 09/10/2014   No results found for: NA, K, CHLORIDE, CO2, GLUCOSE, BUN, CREATININE, BILITOT, ALKPHOS, AST, ALT, PROT, ALBUMIN, CALCIUM, ANIONGAP, EGFR, GFR Lab Results  Component Value Date   CHOL 139 09/10/2014   Lab Results  Component Value Date   HDL 42 09/10/2014   Lab Results  Component Value Date   LDLCALC 86 09/10/2014   Lab Results  Component Value Date   TRIG 54 09/10/2014   Lab Results  Component Value Date   CHOLHDL 3.3 09/10/2014   No results found for: HGBA1C    Assessment & Plan:   Problem List Items Addressed This  Visit    None    Visit Diagnoses    Flexural atopic dermatitis    -  Primary   Wheezing       Moderate persistent chronic asthma without complication       Relevant Medications   montelukast (SINGULAIR) 10 MG tablet   fluticasone (FLOVENT HFA) 110 MCG/ACT inhaler   Other Relevant Orders   Flu Vaccine QUAD  36+ mos IM (Completed)   Ambulatory referral to Pulmonology   Need for prophylactic vaccination and inoculation against influenza       Relevant Orders   Flu Vaccine QUAD 36+ mos IM (Completed)     Referral to Pulmonology for evaluation and assistance with medications Gave flu vaccine today Given the history of asthma pt should get allergy testing Discussed starting singulair Added flovent to mail order to see if that will help cost   Meds ordered this encounter  Medications  . montelukast (SINGULAIR) 10 MG tablet    Sig: Take 1 tablet (10 mg total) by mouth at bedtime.    Dispense:  90 tablet    Refill:  3  . fluticasone (FLOVENT HFA) 110 MCG/ACT inhaler    Sig: Inhale 1 puff into the lungs 2 (two) times daily.    Dispense:  3 Inhaler    Refill:  1    Follow-up: No follow-ups on file.   A total of 30 minutes were spent face-to-face with the patient during this encounter and over half of that time was spent on counseling and coordination of care.  Forrest Moron, MD

## 2018-11-21 NOTE — Patient Instructions (Addendum)
If you have lab work done today you will be contacted with your lab results within the next 2 weeks.  If you have not heard from [place] then please contact us. The fastest way to get your results is to register for My Chart.   IF you received an x-ray today, you will receive an invoice from [facility]. Please contact [facility] at [phone] with questions or concerns regarding your invoice.   IF you received labwork today, you will receive an invoice from [facility]. Please contact [facility] at [phone] with questions or concerns regarding your invoice.   Our billing staff will not be able to assist you with questions regarding bills from these companies.  You will be contacted with the lab results as soon as they are available. The fastest way to get your results is to activate your My Chart account. Instructions are located on the last page of this paperwork. If you have not heard from [place] regarding the results in 2 weeks, please contact this office.     Asthma, Adult  Asthma is a long-term (chronic) condition that causes recurrent episodes in which the airways become tight and narrow. The airways are the passages that lead from the nose and mouth down into the lungs. Asthma episodes, also called asthma attacks, can cause coughing, wheezing, shortness of breath, and chest pain. The airways can also fill with mucus. During an attack, it can be difficult to breathe. Asthma attacks can range from minor to life threatening. Asthma cannot be cured, but medicines and lifestyle changes can help control it and treat acute attacks. What are the causes? This condition is believed to be caused by inherited (genetic) and environmental factors, but its exact cause is not known. There are many things that can bring on an asthma attack or make asthma symptoms worse (triggers). Asthma triggers are different for each person. Common triggers include:  Mold.  Dust.  Cigarette  smoke.  Cockroaches.  Things that can cause allergy symptoms (allergens), such as animal dander or pollen from trees or grass.  Air pollutants such as household cleaners, wood smoke, smog, or Advertising account planner.  Cold air, weather changes, and winds (which increase molds and pollen in the air).  Strong emotional expressions such as crying or laughing hard.  Stress.  Certain medicines (such as aspirin) or types of medicines (such as beta-blockers).  Sulfites in foods and drinks. Foods and drinks that may contain sulfites include dried fruit, potato chips, and sparkling grape juice.  Infections or inflammatory conditions such as the flu, a cold, or inflammation of the nasal membranes (rhinitis).  Gastroesophageal reflux disease (GERD).  Exercise or strenuous activity. What are the signs or symptoms? Symptoms of this condition may occur right after asthma is triggered or many hours later. Symptoms include:  Wheezing. This can sound like whistling when you breathe.  Excessive nighttime or early morning coughing.  Frequent or severe coughing with a common cold.  Chest tightness.  Shortness of breath.  Tiredness (fatigue) with minimal activity. How is this diagnosed? This condition is diagnosed based on:  Your medical history.  A physical exam.  Tests, which may include: ? Lung function studies and pulmonary studies (spirometry). These tests can evaluate the flow of air in your lungs. ? Allergy tests. ? Imaging tests, such as X-rays. How is this treated? There is no cure for this condition, but treatment can help control your symptoms. Treatment for asthma usually involves:  Identifying and avoiding your  asthma triggers.  Using medicines to control your symptoms. Generally, two types of medicines are used to treat asthma: ? Controller medicines. These help prevent asthma symptoms from occurring. They are usually taken every day. ? Fast-acting reliever or rescue medicines.  These quickly relieve asthma symptoms by widening the narrow and tight airways. They are used as needed and provide short-term relief.  Using supplemental oxygen. This may be needed during a severe episode.  Using other medicines, such as: ? Allergy medicines, such as antihistamines, if your asthma attacks are triggered by allergens. ? Immune medicines (immunomodulators). These are medicines that help control the immune system.  Creating an asthma action plan. An asthma action plan is a written plan for managing and treating your asthma attacks. This plan includes: ? A list of your asthma triggers and how to avoid them. ? Information about when medicines should be taken and when their dosage should be changed. ? Instructions about using a device called a peak flow meter. A peak flow meter measures how well the lungs are working and the severity of your asthma. It helps you monitor your condition. Follow these instructions at home: Controlling your home environment Control your home environment in the following ways to help avoid triggers and prevent asthma attacks:  Change your heating and air conditioning filter regularly.  Limit your use of fireplaces and wood stoves.  Get rid of pests (such as roaches and mice) and their droppings.  Throw away plants if you see mold on them.  Clean floors and dust surfaces regularly. Use unscented cleaning products.  Try to have someone else vacuum for you regularly. Stay out of rooms while they are being vacuumed and for a short while afterward. If you vacuum, use a dust mask from a hardware store, a double-layered or microfilter vacuum cleaner bag, or a vacuum cleaner with a HEPA filter.  Replace carpet with wood, tile, or vinyl flooring. Carpet can trap dander and dust.  Use allergy-proof pillows, mattress covers, and box spring covers.  Keep your bedroom a trigger-free room.  Avoid pets and keep windows closed when allergens are in the  air.  Wash beddings every week in hot water and dry them in a dryer.  Use blankets that are made of polyester or cotton.  Clean bathrooms and kitchens with bleach. If possible, have someone repaint the walls in these rooms with mold-resistant paint. Stay out of the rooms that are being cleaned and painted.  Wash your hands often with soap and water. If soap and water are not available, use hand sanitizer.  Do not allow anyone to smoke in your home. General instructions  Take over-the-counter and prescription medicines only as told by your health care provider. ? Speak with your health care provider if you have questions about how or when to take the medicines. ? Make note if you are requiring more frequent dosages.  Do not use any products that contain nicotine or tobacco, such as cigarettes and e-cigarettes. If you need help quitting, ask your health care provider. Also, avoid being exposed to secondhand smoke.  Use a peak flow meter as told by your health care provider. Record and keep track of the readings.  Understand and use the asthma action plan to help minimize, or stop an asthma attack, without needing to seek medical care.  Make sure you stay up to date on your yearly vaccinations as told by your health care provider. This may include vaccines for the flu and pneumonia.  Avoid outdoor activities when allergen counts are high and when air quality is low.  Wear a ski mask that covers your nose and mouth during outdoor winter activities. Exercise indoors on cold days if you can.  Warm up before exercising, and take time for a cool-down period after exercise.  Keep all follow-up visits as told by your health care provider. This is important. Where to find more information  For information about asthma, turn to the Centers for Disease Control and Prevention at http://www.mills-berg.com/www.cdc.gov/asthma/faqs.htm  For air quality information, turn to AirNow at GymCourt.nohttps://airnow.gov/ Contact a health  care provider if:  You have wheezing, shortness of breath, or a cough even while you are taking medicine to prevent attacks.  The mucus you cough up (sputum) is thicker than usual.  Your sputum changes from clear or white to yellow, green, gray, or bloody.  Your medicines are causing side effects, such as a rash, itching, swelling, or trouble breathing.  You need to use a reliever medicine more than 2-3 times a week.  Your peak flow reading is still at 50-79% of your personal best after following your action plan for 1 hour.  You have a fever. Get help right away if:  You are getting worse and do not respond to treatment during an asthma attack.  You are short of breath when at rest or when doing very little physical activity.  You have difficulty eating, drinking, or talking.  You have chest pain or tightness.  You develop a fast heartbeat or palpitations.  You have a bluish color to your lips or fingernails.  You are light-headed or dizzy, or you faint.  Your peak flow reading is less than 50% of your personal best.  You feel too tired to breathe normally. Summary  Asthma is a long-term (chronic) condition that causes recurrent episodes in which the airways become tight and narrow. These episodes can cause coughing, wheezing, shortness of breath, and chest pain.  Asthma cannot be cured, but medicines and lifestyle changes can help control it and treat acute attacks.  Make sure you understand how to avoid triggers and how and when to use your medicines.  Asthma attacks can range from minor to life threatening. Get help right away if you have an asthma attack and do not respond to treatment with your usual rescue medicines. This information is not intended to replace advice given to you by your health care provider. Make sure you discuss any questions you have with your health care provider. Document Released: 02/05/2005 Document Revised: 04/10/2018 Document Reviewed:  03/12/2016 Elsevier Patient Education  2020 [employer]

## 2018-11-24 ENCOUNTER — Telehealth: Payer: Self-pay | Admitting: Family Medicine

## 2018-11-24 NOTE — Telephone Encounter (Signed)
Copied from [place] [id number]. Topic: General - Other >> Nov 24, 2018  1:46 PM Mcneil, Ja-Kwan wrote: Reason for CRM: Pt mother Brayton Layman stated pt has been sick since getting the flu vaccine last week and is unable to go to work so he needs a doctor's note for his job.

## 2018-11-25 NOTE — Telephone Encounter (Signed)
Note given to pt excusing him from work on 11/24/2018 and returning on 11/25/2018.  Pt requesting note for today and tomorrow.  I advised he would need an ov to get a note for today and provider would decide if he needed to be out of work on 11/26/2018.

## 2018-12-22 ENCOUNTER — Encounter: Payer: Self-pay | Admitting: Family Medicine

## 2018-12-22 ENCOUNTER — Ambulatory Visit (INDEPENDENT_AMBULATORY_CARE_PROVIDER_SITE_OTHER): Payer: Managed Care, Other (non HMO) | Admitting: Family Medicine

## 2018-12-22 ENCOUNTER — Other Ambulatory Visit: Payer: Self-pay

## 2018-12-22 VITALS — BP 131/75 | HR 71 | Temp 98.3°F | Wt 192.2 lb

## 2018-12-22 DIAGNOSIS — L2089 Other atopic dermatitis: Secondary | ICD-10-CM

## 2018-12-22 DIAGNOSIS — Z91011 Allergy to milk products: Secondary | ICD-10-CM | POA: Diagnosis not present

## 2018-12-22 DIAGNOSIS — J4540 Moderate persistent asthma, uncomplicated: Secondary | ICD-10-CM | POA: Diagnosis not present

## 2018-12-22 MED ORDER — TRIAMCINOLONE ACETONIDE 0.1 % EX CREA: 1.0000 "application " | TOPICAL_CREAM | Freq: Two times a day (BID) | CUTANEOUS | 2 refills | Status: DC

## 2018-12-22 MED ORDER — CLARITIN REDITABS 10 MG PO TBDP: 10.0000 mg | ORAL_TABLET | Freq: Every day | ORAL | 11 refills | Status: DC

## 2018-12-22 MED ORDER — TRIAMCINOLONE ACETONIDE 0.025 % EX OINT: 1.0000 "application " | TOPICAL_OINTMENT | Freq: Two times a day (BID) | CUTANEOUS | 1 refills | Status: DC

## 2018-12-22 MED ORDER — CERAVE EX LOTN: 1.0000 "application " | TOPICAL_LOTION | Freq: Two times a day (BID) | CUTANEOUS | 1 refills | Status: DC

## 2018-12-22 NOTE — Patient Instructions (Addendum)
Eczema FAQs       What is eczema? . Eczema (also known as atopic dermatitis) is a common skin condition characterized by an itchy/scaly, red, rash.  What are the signs of eczema? . Eczema is characterized by red, thickened patches of skin.  The rash may also appear as tiny blisters (vesicles) that can rupture, weep, and crust.   Why do I have eczema? . No one knows the exact cause of eczema, but it does tend to run in families. . It is associates with extremely dry and sensitive skin, causing them to itch/scratch. . The constant scratching leads to the development of the characteristic rash.    Is eczema contagious? . NO!  Eczema is not contagious.   What will happen if my  eczema is left untreated? . It will most likely resolve on its own. . Bad, chronic, eczema can lead to scarring and skin discoloration, so it's important to treat eczema appropriately.  How to Treat Eczema:  Step # 1: MOISTURIZE, MOISTURIZE, MOISTURIZE!!! . Your dry skin causes him/her to itch.  The constant scratching of his/her sensitive skin leads worsening of eczema. . Moisturize at least twice daily in the cold times.  The best time to do this is right after a luke warm bath. o Bathe in luke warm water every other day in the dry season. Wipe up the other times.  o Gently pat your skin dry, leaving skin slightly damp. o Within 2 minutes of getting out of the tub, cover skin from head to toe with a thick coating of moisturizing cream or ointment.  . DO USE: moisturizing cream that you have to scoop from a jar, such as Cetaphil cream, or Eucerin Cream.  Ointments, such as plain Vasoline or Auquaphor work great as well. . DO NOT USE: any lotions/bathwashes that you have to pump.  These lotions can have alcohol as a base and can be more drying that moisturizing.  Also, do not use any moisturizers that have a scent.  If it smells good, don't use it!!!  Step # 2: AVOID TRIGGERS . So avoid any products that  cause irritation. Marland Kitchen Avoid anything with perfumes: This includes scented soaps, bubble baths, scented laundry detergents, etc. . Remember to wash your own clothing and sheets in scent-free detergent as well as well as only sensitive skin liquid softner that is unscented.  . Some food allergies that trigger eczema flairs.  So it is important to do an allergy test . DO USE: unscented products, such as Dove bar soap, ALL Free and Clear (laundry detergent), and Tide, unscented (laundry detergent). . Watch out for: products marketed for babies, such as baby soaps and shampoos.                   Step # 3: Topical Corticosteroids/prescription creams: . Your doctor may suggest using steroid creams on the particularly rough patches of skin that are common in eczema. . Over the counter steroid creams, such as Triamcinolone 0.1% cream work well on most eczema.  You may apply a small amount to rough patches of skin 1-2 times daily until the rash clears. . Steroid creams can cause skin discoloration, thinning of the skin, and may have systemic effects when used in excess. . Avoid using excessive amounts and strong steroid creams on the face and in the groin area.  Use steroid creams only as directed, only on small patches of skin, and only for short  periods of time.                                      ECZEMA PEARLS:  . Moisturize!  Moisturize at least once daily. o Dry skin leads to irritation, causing eczema flairs. . If it smells good, don't use it! o Scented products will irritate your sensitive skin. . Use steroid creams if necessary, but only as directed by your doctor. . Generic products work just as well as name brand products.  Ask your pharmacist where to find equivalent generic brands.    If you have lab work done today you will be contacted with your lab results within the next 2 weeks.  If you have not heard from [place] then please contact us. The fastest way to get your results is to register  for My Chart.   IF you received an x-ray today, you will receive an invoice from [facility]. Please contact [facility] at [phone] with questions or concerns regarding your invoice.   IF you received labwork today, you will receive an invoice from [facility]. Please contact [facility] at [phone] with questions or concerns regarding your invoice.   Our billing staff will not be able to assist you with questions regarding bills from these companies.  You will be contacted with the lab results as soon as they are available. The fastest way to get your results is to activate your My Chart account. Instructions are located on the last page of this paperwork. If you have not heard from [place] regarding the results in 2 weeks, please contact this office.

## 2018-12-22 NOTE — Progress Notes (Signed)
Established Patient Office Visit  Subjective:  Patient ID: Bradley Maynard, male    DOB: 1995/12/07  Age: 23 y.o. MRN: [id number]  CC:  Chief Complaint  Patient presents with  . Medical Management of Chronic Issues    1 month f/u was last seen on 11/21/18    HPI Bradley Maynard presents for   Eczema He reports that his eczema is slightly   He uses [place] eczema and all natural lotion He is taking the singulair He had chick fil a nuggets and there was milk in it and it set off his allergies   Asthma He is now using the flovent bid and singulair every night  He has fewer episodes of asthma now that he is avoiding milk He has much fewer wheezing and night time episodes.  He already had a flu shot He does not smoke    Past Medical History:  Diagnosis Date  . Allergy   . Asthma     Past Surgical History:  Procedure Laterality Date  . NO PAST SURGERIES      Family History  Problem Relation Age of Onset  . Diabetes Mother   . Hypertension Mother   . Hypertension Father     Social History   Socioeconomic History  . Marital status: Single    Spouse name: Not on file  . Number of children: Not on file  . Years of education: Not on file  . Highest education level: Not on file  Occupational History  . Not on file  Social Needs  . Financial resource strain: Not on file  . Food insecurity    Worry: Not on file    Inability: Not on file  . Transportation needs    Medical: Not on file    Non-medical: Not on file  Tobacco Use  . Smoking status: Never Smoker  . Smokeless tobacco: Never Used  Substance and Sexual Activity  . Alcohol use: No  . Drug use: No  . Sexual activity: Not on file  Lifestyle  . Physical activity    Days per week: Not on file    Minutes per session: Not on file  . Stress: Not on file  Relationships  . Social Herbalist on phone: Not on file    Gets together: Not on file    Attends religious service: Not on file   Active member of club or organization: Not on file    Attends meetings of clubs or organizations: Not on file    Relationship status: Not on file  . Intimate partner violence    Fear of current or ex partner: Not on file    Emotionally abused: Not on file    Physically abused: Not on file    Forced sexual activity: Not on file  Other Topics Concern  . Not on file  Social History Narrative  . Not on file    Outpatient Medications Prior to Visit  Medication Sig Dispense Refill  . albuterol (VENTOLIN HFA) 108 (90 Base) MCG/ACT inhaler Inhale 2 puffs into the lungs every 6 (six) hours as needed for wheezing or shortness of breath. 18 g 3  . fluticasone (FLOVENT HFA) 110 MCG/ACT inhaler Inhale 1 puff into the lungs 2 (two) times daily. 3 Inhaler 1  . montelukast (SINGULAIR) 10 MG tablet Take 1 tablet (10 mg total) by mouth at bedtime. 90 tablet 3  . EPINEPHrine (EPIPEN) 0.3 mg/0.3 mL IJ SOAJ injection Inject 0.3 mLs (  0.3 mg total) into the muscle once for 1 dose. 0.3 mL 0  . triamcinolone (KENALOG) 0.025 % ointment Apply 1 application topically 2 (two) times daily.     No facility-administered medications prior to visit.     Allergies  Allergen Reactions  . Milk-Related Compounds Anaphylaxis    ROS Review of Systems Review of Systems  Constitutional: Negative for activity change, appetite change, chills and fever.  HENT: Negative for congestion, nosebleeds, trouble swallowing and voice change.   Respiratory: Negative for cough, shortness of breath and wheezing.   Gastrointestinal: Negative for diarrhea, nausea and vomiting.  Genitourinary: Negative for difficulty urinating, dysuria, flank pain and hematuria.  Musculoskeletal: Negative for back pain, joint swelling and neck pain.  Neurological: Negative for dizziness, speech difficulty, light-headedness and numbness.  See HPI. All other review of systems negative.     Objective:    Physical Exam  BP 131/75 (BP Location: Right  Arm, Patient Position: Sitting, Cuff Size: Normal)   Pulse 71   Temp 98.3 F (36.8 C) (Oral)   Wt 192 lb 3.2 oz (87.2 kg)   SpO2 99%   BMI 28.38 kg/m  Wt Readings from Last 3 Encounters:  12/22/18 192 lb 3.2 oz (87.2 kg)  11/21/18 189 lb 6.4 oz (85.9 kg)  10/23/18 190 lb (86.2 kg)   Physical Exam  Constitutional: Oriented to person, place, and time. Appears well-developed and well-nourished.  HENT:  Head: Normocephalic and atraumatic.  Eyes: Conjunctivae and EOM are normal.  Cardiovascular: Normal rate, regular rhythm, normal heart sounds and intact distal pulses.  No murmur heard. Pulmonary/Chest: Effort normal and breath sounds normal. No stridor. No respiratory distress. Has no wheezes.  Neurological: Is alert and oriented to person, place, and time.  Skin: Skin is warm. Capillary refill takes less than 2 seconds. Hyperpigmented lesions on neck, forehead and hyperpigmented excoriated lesion on the hands Psychiatric: Has a normal mood and affect. Behavior is normal. Judgment and thought content normal.    Health Maintenance Due  Topic Date Due  . HIV Screening  09/12/2011  . TETANUS/TDAP  09/12/2015    There are no preventive care reminders to display for this patient.  No results found for: TSH Lab Results  Component Value Date   WBC 3.6 (L) 09/10/2014   HGB 15.0 09/10/2014   HCT 45.6 09/10/2014   MCV 82.9 09/10/2014   PLT 252 09/10/2014   No results found for: NA, K, CHLORIDE, CO2, GLUCOSE, BUN, CREATININE, BILITOT, ALKPHOS, AST, ALT, PROT, ALBUMIN, CALCIUM, ANIONGAP, EGFR, GFR Lab Results  Component Value Date   CHOL 139 09/10/2014   Lab Results  Component Value Date   HDL 42 09/10/2014   Lab Results  Component Value Date   LDLCALC 86 09/10/2014   Lab Results  Component Value Date   TRIG 54 09/10/2014   Lab Results  Component Value Date   CHOLHDL 3.3 09/10/2014   No results found for: HGBA1C    Assessment & Plan:   Problem List Items Addressed  This Visit    None    Visit Diagnoses    Flexural atopic dermatitis    -  Primary Spent over 20 minutes discussing common ingredients in every day items that  Might irritate the skin Discussing home care Reviewing foods with milk compounds that people might now expect even some breads Discussed singulair and the relationship witween ashtma and atopy   Relevant Medications   triamcinolone cream (KENALOG) 0.1 %   Emollient (CERAVE) LOTN  Milk allergy       Moderate persistent chronic asthma without complication    - much improved with more control and less reliance on albuterol Cost is still an issue but he was able to get 2 flovent for $150      Meds ordered this encounter  Medications  . DISCONTD: triamcinolone (KENALOG) 0.025 % ointment    Sig: Apply 1 application topically 2 (two) times daily.    Dispense:  80 g    Refill:  1  . triamcinolone cream (KENALOG) 0.1 %    Sig: Apply 1 application topically 2 (two) times daily.    Dispense:  80 g    Refill:  2    Discontinue triamcinolone ointment  . Emollient (CERAVE) LOTN    Sig: Apply 1 application topically 2 (two) times daily.    Dispense:  355 mL    Refill:  1  . loratadine (CLARITIN REDITABS) 10 MG dissolvable tablet    Sig: Take 1 tablet (10 mg total) by mouth daily.    Dispense:  30 tablet    Refill:  11    Follow-up: No follow-ups on file.   A total of 25 minutes were spent face-to-face with the patient during this encounter and over half of that time was spent on counseling and coordination of care.   Forrest Moron, MD

## 2019-01-11 NOTE — Progress Notes (Signed)
Established Patient Office Visit  Subjective:  Patient ID: Bradley Maynard, male    DOB: [birth year]  Age: 23 y.o. MRN: [id number]  CC:  Chief Complaint  Patient presents with  . Eczema    3 wk f/u -- not getting better     HPI Bradley Maynard presents for   Pt is here for follow up for his severe eczema  He is using the mild soap, the cooler water to shower and not showering as long He is taking the montelukast which is helping with his allergies, asthma but not the eczema He states that he is still as itchy as before The Triamcinolone 0.1% was not helping He is taking aspirin as well.     Past Medical History:  Diagnosis Date  . Allergy   . Asthma     Past Surgical History:  Procedure Laterality Date  . NO PAST SURGERIES      Family History  Problem Relation Age of Onset  . Diabetes Mother   . Hypertension Mother   . Hypertension Father     Social History   Socioeconomic History  . Marital status: Single    Spouse name: Not on file  . Number of children: Not on file  . Years of education: Not on file  . Highest education level: Not on file  Occupational History  . Not on file  Social Needs  . Financial resource strain: Not on file  . Food insecurity    Worry: Not on file    Inability: Not on file  . Transportation needs    Medical: Not on file    Non-medical: Not on file  Tobacco Use  . Smoking status: Never Smoker  . Smokeless tobacco: Never Used  Substance and Sexual Activity  . Alcohol use: No  . Drug use: No  . Sexual activity: Not on file  Lifestyle  . Physical activity    Days per week: Not on file    Minutes per session: Not on file  . Stress: Not on file  Relationships  . Social Herbalist on phone: Not on file    Gets together: Not on file    Attends religious service: Not on file    Active member of club or organization: Not on file    Attends meetings of clubs or organizations: Not on file    Relationship status:  Not on file  . Intimate partner violence    Fear of current or ex partner: Not on file    Emotionally abused: Not on file    Physically abused: Not on file    Forced sexual activity: Not on file  Other Topics Concern  . Not on file  Social History Narrative  . Not on file    Outpatient Medications Prior to Visit  Medication Sig Dispense Refill  . albuterol (VENTOLIN HFA) 108 (90 Base) MCG/ACT inhaler Inhale 2 puffs into the lungs every 6 (six) hours as needed for wheezing or shortness of breath. 18 g 3  . Emollient (CERAVE) LOTN Apply 1 application topically 2 (two) times daily. 355 mL 1  . fluticasone (FLOVENT HFA) 110 MCG/ACT inhaler Inhale 1 puff into the lungs 2 (two) times daily. 3 Inhaler 1  . loratadine (CLARITIN REDITABS) 10 MG dissolvable tablet Take 1 tablet (10 mg total) by mouth daily. 30 tablet 11  . montelukast (SINGULAIR) 10 MG tablet Take 1 tablet (10 mg total) by mouth at bedtime. 90 tablet 3  .  triamcinolone cream (KENALOG) 0.1 % Apply 1 application topically 2 (two) times daily. 80 g 2  . EPINEPHrine (EPIPEN) 0.3 mg/0.3 mL IJ SOAJ injection Inject 0.3 mLs (0.3 mg total) into the muscle once for 1 dose. 0.3 mL 0   No facility-administered medications prior to visit.     Allergies  Allergen Reactions  . Milk-Related Compounds Anaphylaxis    ROS Review of Systems Review of Systems  Constitutional: Negative for activity change, appetite change, chills and fever.  HENT: Negative for congestion, nosebleeds, trouble swallowing and voice change.   Respiratory: Negative for cough, shortness of breath and wheezing.   Gastrointestinal: Negative for diarrhea, nausea and vomiting.  Genitourinary: Negative for difficulty urinating, dysuria, flank pain and hematuria.  Musculoskeletal: Negative for back pain, joint swelling and neck pain.  Neurological: Negative for dizziness, speech difficulty, light-headedness and numbness.  See HPI. All other review of systems negative.      Objective:    Physical Exam  BP 134/85   Pulse 88   Temp 98.6 F (37 C) (Oral)   Ht 5' 9" (1.753 m)   Wt 190 lb (86.2 kg)   SpO2 96%   BMI 28.06 kg/m  Wt Readings from Last 3 Encounters:  01/12/19 190 lb (86.2 kg)  12/22/18 192 lb 3.2 oz (87.2 kg)  11/21/18 189 lb 6.4 oz (85.9 kg)   Physical Exam  Constitutional: Oriented to person, place, and time. Appears well-developed and well-nourished.  HENT:  Head: Normocephalic and atraumatic.  Eyes: Conjunctivae and EOM are normal.  Pulmonary/Chest: Effort normal. No respiratory distress.  Neurological: Is alert and oriented to person, place, and time.  Skin: Skin is warm. Capillary refill takes less than 2 seconds.  Psychiatric: Has a normal mood and affect. Behavior is normal. Judgment and thought content normal.     Health Maintenance Due  Topic Date Due  . HIV Screening  09/12/2011  . TETANUS/TDAP  09/12/2015    There are no preventive care reminders to display for this patient.  No results found for: TSH Lab Results  Component Value Date   WBC 3.6 (L) 09/10/2014   HGB 15.0 09/10/2014   HCT 45.6 09/10/2014   MCV 82.9 09/10/2014   PLT 252 09/10/2014   No results found for: NA, K, CHLORIDE, CO2, GLUCOSE, BUN, CREATININE, BILITOT, ALKPHOS, AST, ALT, PROT, ALBUMIN, CALCIUM, ANIONGAP, EGFR, GFR Lab Results  Component Value Date   CHOL 139 09/10/2014   Lab Results  Component Value Date   HDL 42 09/10/2014   Lab Results  Component Value Date   LDLCALC 86 09/10/2014   Lab Results  Component Value Date   TRIG 54 09/10/2014   Lab Results  Component Value Date   CHOLHDL 3.3 09/10/2014   No results found for: HGBA1C    Assessment & Plan:   Problem List Items Addressed This Visit    None    Visit Diagnoses    Severe eczema    -  Primary   Relevant Orders   Ambulatory referral to Dermatology   Flexural atopic dermatitis       Relevant Orders   Ambulatory referral to Dermatology     Increased  potency of steroids Continue home care, advised pt to follow up with Dermatology   Meds ordered this encounter  Medications  . betamethasone dipropionate (DIPROLENE) 0.05 % ointment    Sig: Apply topically 2 (two) times daily.    Dispense:  30 g    Refill:  0  Follow-up: No follow-ups on file.    Forrest Moron, MD

## 2019-01-12 ENCOUNTER — Ambulatory Visit (INDEPENDENT_AMBULATORY_CARE_PROVIDER_SITE_OTHER): Payer: Managed Care, Other (non HMO) | Admitting: Family Medicine

## 2019-01-12 ENCOUNTER — Encounter: Payer: Self-pay | Admitting: Family Medicine

## 2019-01-12 ENCOUNTER — Other Ambulatory Visit: Payer: Self-pay

## 2019-01-12 VITALS — BP 134/85 | HR 88 | Temp 98.6°F | Ht 69.0 in | Wt 190.0 lb

## 2019-01-12 DIAGNOSIS — L2089 Other atopic dermatitis: Secondary | ICD-10-CM

## 2019-01-12 DIAGNOSIS — L309 Dermatitis, unspecified: Secondary | ICD-10-CM | POA: Diagnosis not present

## 2019-01-12 MED ORDER — BETAMETHASONE DIPROPIONATE 0.05 % EX OINT: TOPICAL_OINTMENT | Freq: Two times a day (BID) | CUTANEOUS | 0 refills | Status: DC

## 2019-01-12 NOTE — Patient Instructions (Addendum)
If you have lab work done today you will be contacted with your lab results within the next 2 weeks.  If you have not heard from [place] then please contact us. The fastest way to get your results is to register for My Chart.   IF you received an x-ray today, you will receive an invoice from [facility]. Please contact [facility] at [phone] with questions or concerns regarding your invoice.   IF you received labwork today, you will receive an invoice from [place]. Please contact [facility] at [phone] with questions or concerns regarding your invoice.   Our billing staff will not be able to assist you with questions regarding bills from these companies.  You will be contacted with the lab results as soon as they are available. The fastest way to get your results is to activate your My Chart account. Instructions are located on the last page of this paperwork. If you have not heard from [place] regarding the results in 2 weeks, please contact this office.     Hydrocortisone skin cream, ointment, lotion, or solution What is this medicine? HYDROCORTISONE (hye droe KOR ti sone) is a corticosteroid. It is used on the skin to reduce swelling, redness, itching, and allergic reactions. This medicine may be used for other purposes; ask your health care provider or pharmacist if you have questions. COMMON BRAND NAME(S): Ala-Cort, Ala-Scalp, Anusol HC, Aqua Glycolic HC, Balneol for Her, Caldecort, Cetacort, Cortaid, Cortaid Advanced, Cortaid Intensive Therapy, Cortaid Sensitive Skin, CortAlo, Corticaine, Corticool, Cortizone, Cortizone-10, Cortizone-10 Cooling Relief, Cortizone-10 Intensive Healing, Cortizone-10 Plus, Dermarest Dricort, Dermarest Eczema, DERMASORB HC Complete, Gly-Cort, Hycort, Hydro Skin, Hydrocortisone in Absorbase, Hydroskin, Hytone, Instacort, Lacticare HC, Locoid, Locoid Lipocream, MiCort-HC, Monistat Complete Care Instant Itch Relief Cream, Neosporin Eczema,  NuCort, Nutracort, NuZon, Pandel, Pediaderm HC, Penecort, Preparation H Hydrocortisone, Procto-Kit, Procto-Med HC, Procto-Pak, Proctocort, Proctocream-HC, Proctosol-HC, Proctozone-HC, Rederm, Sarnol-HC, Nurse, adult, Engineer, site, [place], Tucks HC, Vagisil Anti-Itch, [facility] Intensive Healing, Human resources officer What should I tell my health care provider before I take this medicine? They need to know if you have any of these conditions:  any active infection  large areas of burned or damaged skin  skin wasting or thinning  an unusual or allergic reaction to hydrocortisone, corticosteroids, sulfites, other medicines, foods, dyes, or preservatives  pregnant or trying to get pregnant  breast-feeding How should I use this medicine? This medicine is for external use only. Do not take by mouth. Follow the directions on the prescription label. Wash your hands before and after use. Apply a thin film of medicine to the affected area. Do not cover with a bandage or dressing unless your doctor or health care professional tells you to. Do not use on healthy skin or over large areas of skin. Do not get this medicine in your eyes. If you do, rinse out with plenty of cool tap water. Do not to use more medicine than prescribed. Do not use your medicine more often than directed or for more than 14 days. Talk to your pediatrician regarding the use of this medicine in children. Special care may be needed. While this drug may be prescribed for children as young as 65 years of age for selected conditions, precautions do apply. Do not use this medicine for the treatment of diaper rash unless directed to do so by your doctor or health care professional. If applying this medicine to the diaper area of a child, do not cover with tight-fitting diapers or plastic pants. This may  increase the amount of medicine that passes through the skin and increase the risk of serious side effects. Elderly patients are more likely to have  damaged skin through aging, and this may increase side effects. This medicine should only be used for brief periods and infrequently in older patients. Overdosage: If you think you have taken too much of this medicine contact a poison control center or emergency room at once. NOTE: This medicine is only for you. Do not share this medicine with others. What if I miss a dose? If you miss a dose, use it as soon as you can. If it is almost time for your next dose, use only that dose. Do not use double or extra doses. What may interact with this medicine? Interactions are not expected. Do not use any other skin products on the affected area without asking your doctor or health care professional. This list may not describe all possible interactions. Give your health care provider a list of all the medicines, herbs, non-prescription drugs, or dietary supplements you use. Also tell them if you smoke, drink alcohol, or use illegal drugs. Some items may interact with your medicine. What should I watch for while using this medicine? Tell your doctor or health care professional if your symptoms do not start to get better within 7 days or if they get worse. Tell your doctor or health care professional if you are exposed to anyone with measles or chickenpox, or if you develop sores or blisters that do not heal properly. What side effects may I notice from receiving this medicine? Side effects that you should report to your doctor or health care professional as soon as possible:  allergic reactions like skin rash, itching or hives, swelling of the face, lips, or tongue  burning feeling on the skin  dark red spots on the skin  infection  lack of healing of skin condition  painful, red, pus filled blisters in hair follicles  thinning of the skin Side effects that usually do not require medical attention (report to your doctor or health care professional if they continue or are bothersome):  dry skin,  irritation  unusual increased growth of hair on the face or body This list may not describe all possible side effects. Call your doctor for medical advice about side effects. You may report side effects to FDA at 1-800-FDA-1088. Where should I keep my medicine? Keep out of the reach of children. Store at room temperature between 15 and 30 degrees C (59 and 86 degrees F). Do not freeze. Throw away any unused medicine after the expiration date. NOTE: This sheet is a summary. It may not cover all possible information. If you have questions about this medicine, talk to your doctor, pharmacist, or health care provider.  2020 Elsevier/Gold Standard (2017-11-06 12:12:55)

## 2019-11-11 IMAGING — CR CHEST - 2 VIEW
3 series · 3 of 3 positions shown · non-contrast
Comparison: February 10, 2018

CLINICAL DATA: Cough and shortness of breath

EXAM:
CHEST - 2 VIEW

[chest pa (1 of 2)]
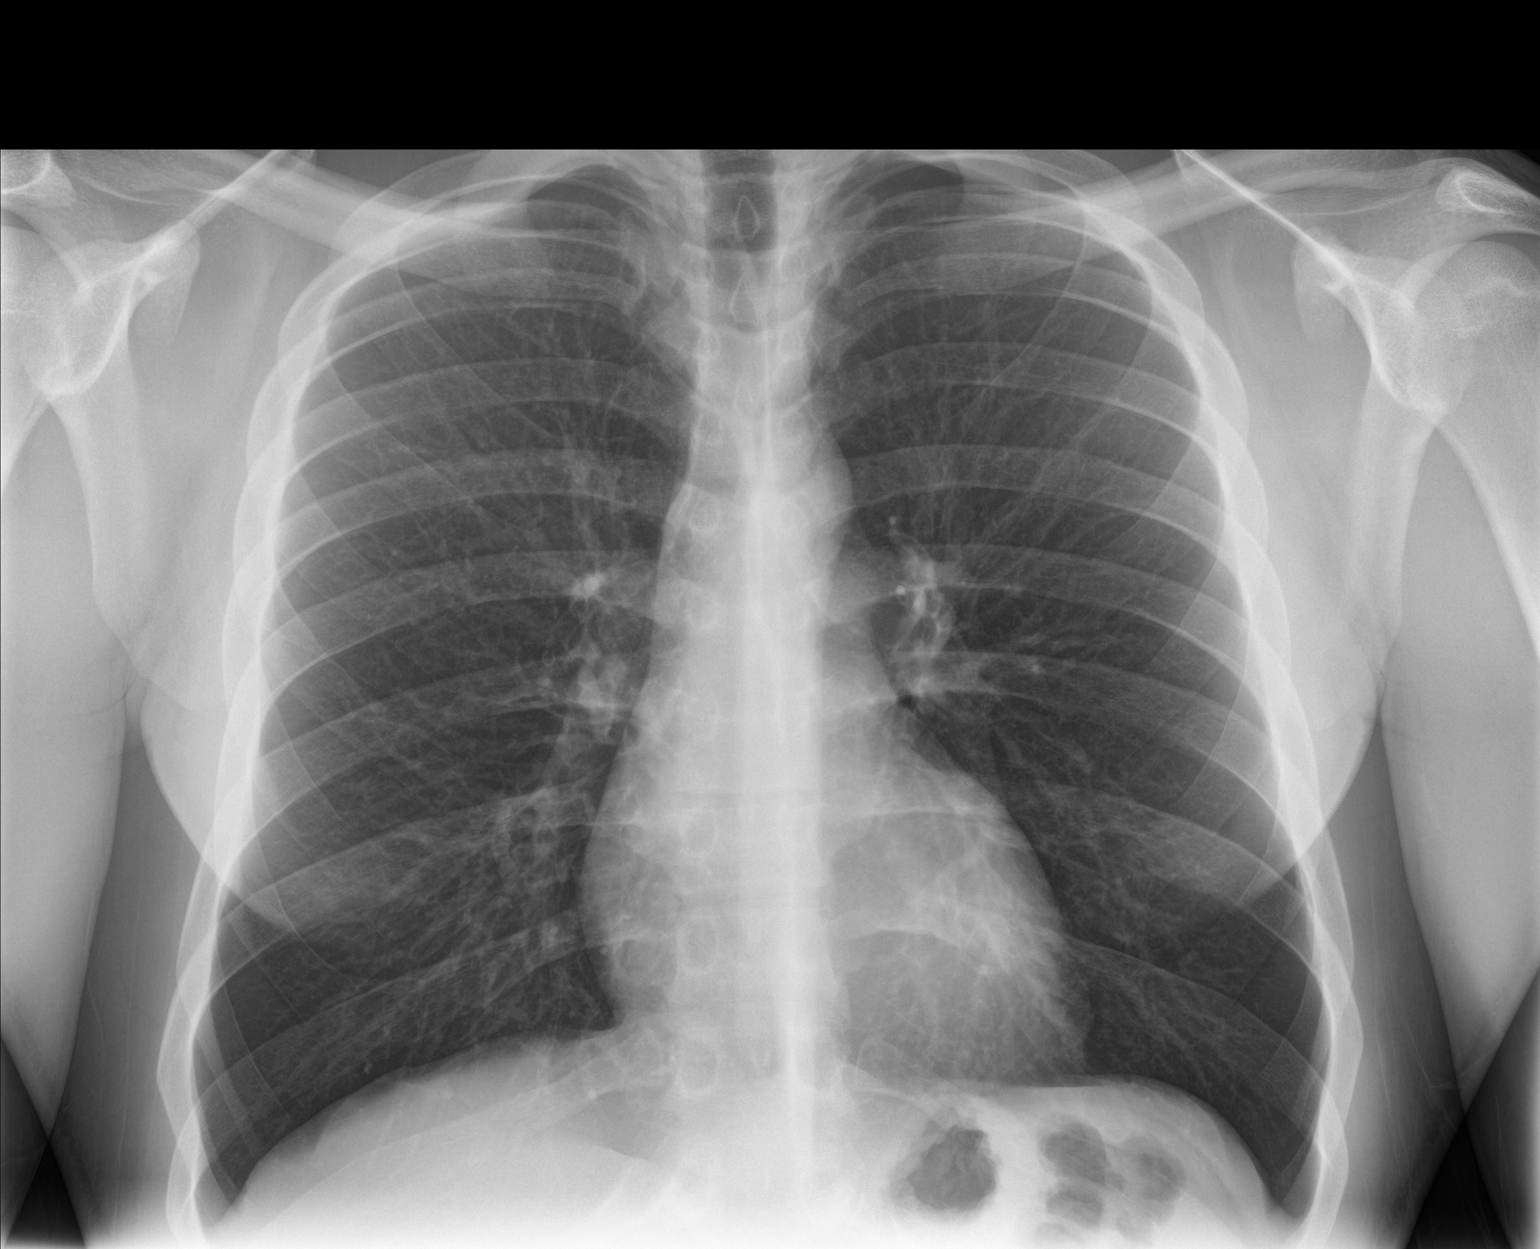

[chest lat]
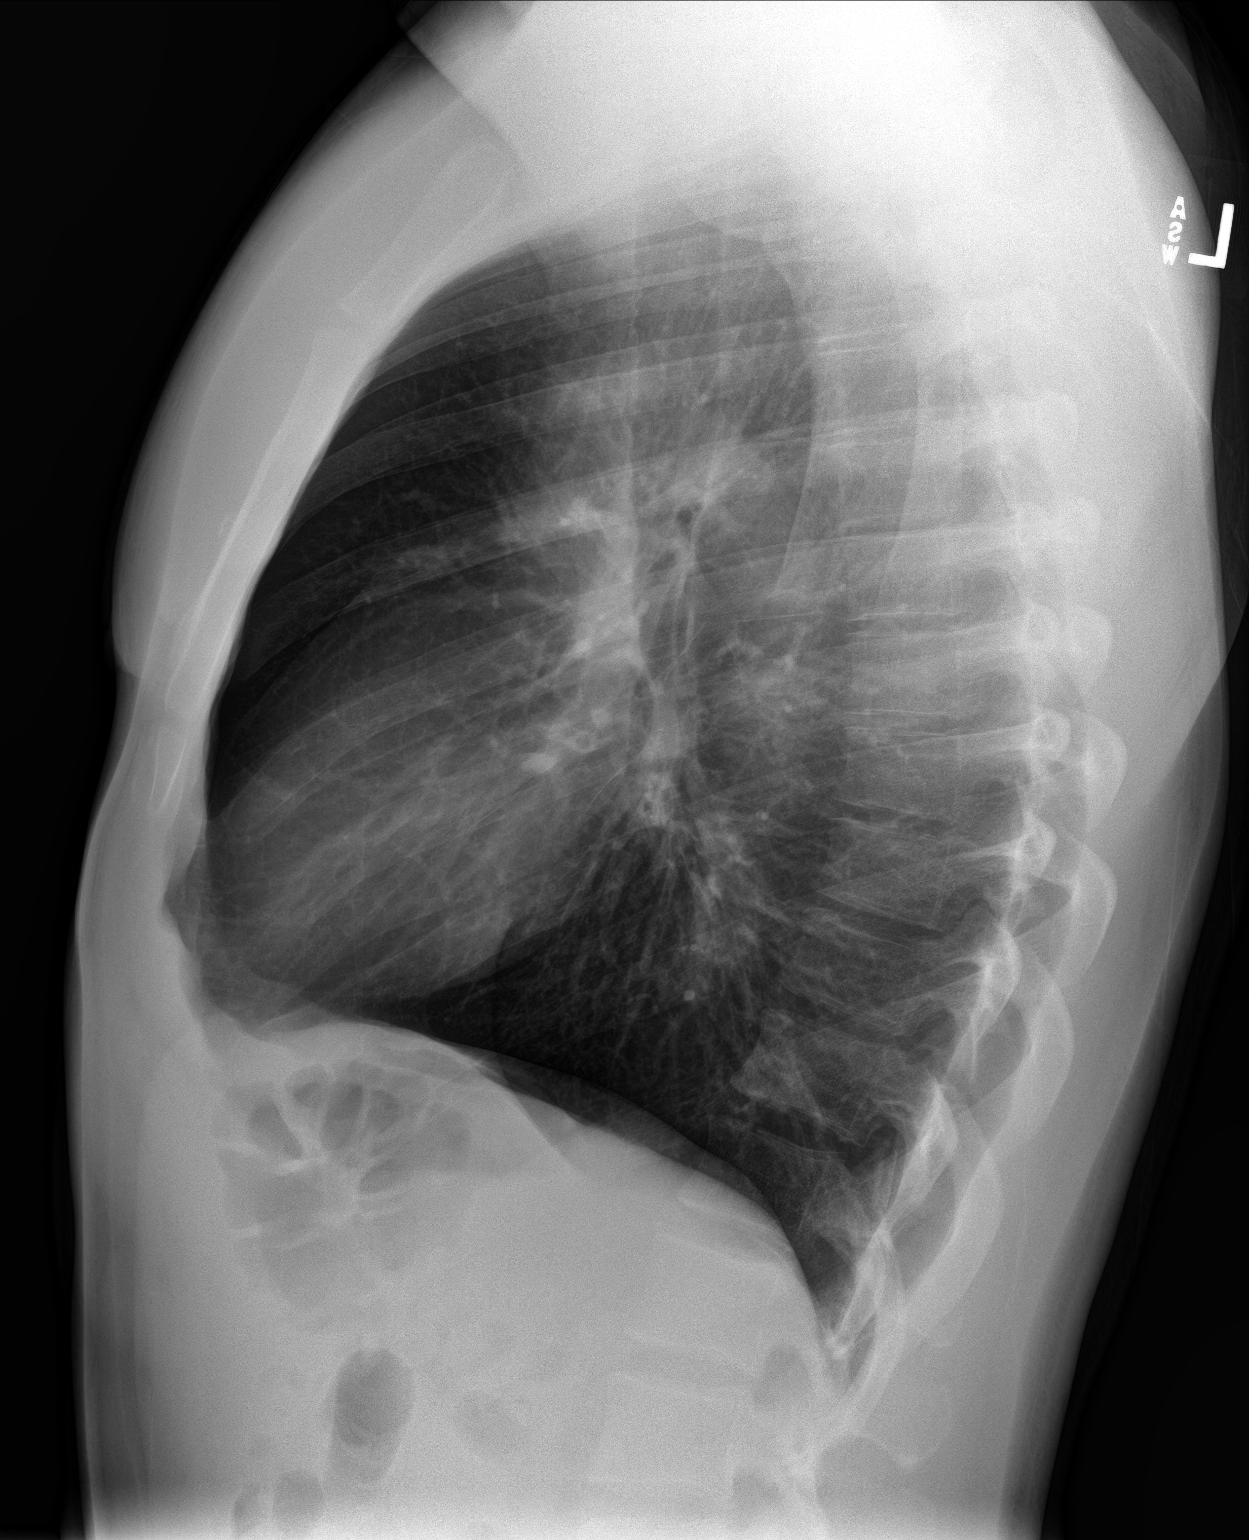

[chest pa (2 of 2)]
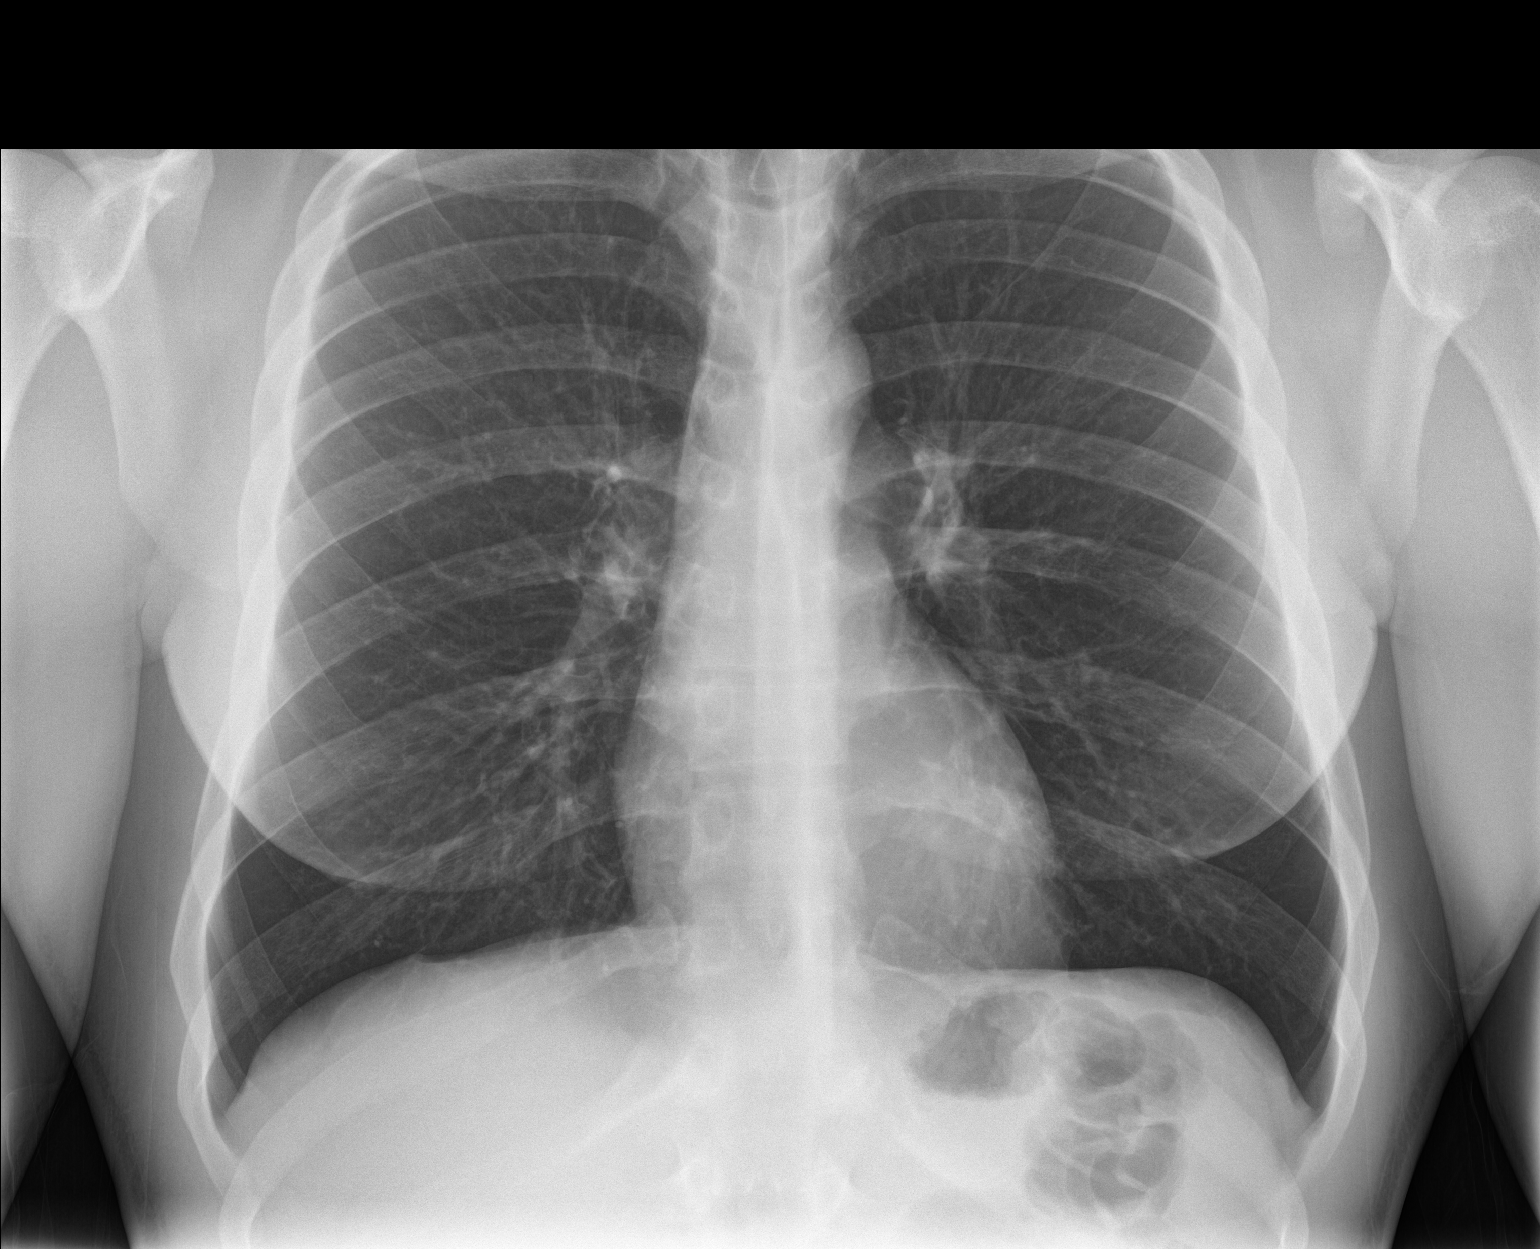

[3 of 3 positions shown; findings below may reference images not displayed]

FINDINGS: Lungs are clear. Heart size and pulmonary vascularity are normal. No
adenopathy. No bone lesions.
IMPRESSION: No edema or consolidation.

## 2021-10-19 ENCOUNTER — Emergency Department (HOSPITAL_COMMUNITY)
Admission: EM | Admit: 2021-10-19 | Discharge: 2021-10-19 | Disposition: A | Discharge status: Home / Self Care | Payer: Managed Care, Other (non HMO) | Attending: Emergency Medicine | Admitting: Emergency Medicine

## 2021-10-19 ENCOUNTER — Encounter (HOSPITAL_COMMUNITY): Payer: Self-pay

## 2021-10-19 ENCOUNTER — Other Ambulatory Visit: Payer: Self-pay

## 2021-10-19 DIAGNOSIS — Z7951 Long term (current) use of inhaled steroids: Secondary | ICD-10-CM | POA: Insufficient documentation

## 2021-10-19 DIAGNOSIS — H02849 Edema of unspecified eye, unspecified eyelid: Secondary | ICD-10-CM | POA: Insufficient documentation

## 2021-10-19 DIAGNOSIS — J45909 Unspecified asthma, uncomplicated: Secondary | ICD-10-CM | POA: Insufficient documentation

## 2021-10-19 DIAGNOSIS — B000 Eczema herpeticum: Secondary | ICD-10-CM | POA: Insufficient documentation

## 2021-10-19 LAB — CBC WITH DIFFERENTIAL/PLATELET
Abs Immature Granulocytes: 0.01 10*3/uL (ref 0.00–0.07)
Basophils Absolute: 0 10*3/uL (ref 0.0–0.1)
Basophils Relative: 0 %
Eosinophils Absolute: 0.7 10*3/uL — ABNORMAL HIGH (ref 0.0–0.5)
Eosinophils Relative: 11 %
HCT: 51.3 % (ref 39.0–52.0)
Hemoglobin: 17 g/dL (ref 13.0–17.0)
Immature Granulocytes: 0 %
Lymphocytes Relative: 20 %
Lymphs Abs: 1.3 10*3/uL (ref 0.7–4.0)
MCH: 27.8 pg (ref 26.0–34.0)
MCHC: 33.1 g/dL (ref 30.0–36.0)
MCV: 84 fL (ref 80.0–100.0)
Monocytes Absolute: 0.4 10*3/uL (ref 0.1–1.0)
Monocytes Relative: 6 %
Neutro Abs: 4 10*3/uL (ref 1.7–7.7)
Neutrophils Relative %: 63 %
Platelets: 249 10*3/uL (ref 150–400)
RBC: 6.11 MIL/uL — ABNORMAL HIGH (ref 4.22–5.81)
RDW: 12.9 % (ref 11.5–15.5)
WBC: 6.4 10*3/uL (ref 4.0–10.5)
nRBC: 0 % (ref 0.0–0.2)

## 2021-10-19 LAB — COMPREHENSIVE METABOLIC PANEL
ALT: 15 U/L (ref 0–44)
AST: 25 U/L (ref 15–41)
Albumin: 4.2 g/dL (ref 3.5–5.0)
Alkaline Phosphatase: 82 U/L (ref 38–126)
Anion gap: 11 (ref 5–15)
BUN: 12 mg/dL (ref 6–20)
CO2: 23 mmol/L (ref 22–32)
Calcium: 10 mg/dL (ref 8.9–10.3)
Chloride: 109 mmol/L (ref 98–111)
Creatinine, Ser: 1.13 mg/dL (ref 0.61–1.24)
GFR, Estimated: >60 mL/min (ref >60)
Glucose, Bld: 87 mg/dL (ref 70–99)
Potassium: 4.1 mmol/L (ref 3.5–5.1)
Sodium: 143 mmol/L (ref 135–145)
Total Bilirubin: 1.3 mg/dL — ABNORMAL HIGH (ref 0.3–1.2)
Total Protein: 7.8 g/dL (ref 6.5–8.1)

## 2021-10-19 LAB — LACTIC ACID, PLASMA: Lactic Acid, Venous: 2.2 mmol/L (ref 0.5–1.9)

## 2021-10-19 MED ORDER — VALACYCLOVIR HCL 500 MG PO TABS
1000.0000 mg | ORAL_TABLET | Freq: Once | ORAL | Status: AC
  Administered 2021-10-19: 1000 mg via ORAL
  Filled 2021-10-19 (×2): qty 2

## 2021-10-19 MED ORDER — VALACYCLOVIR HCL 1 G PO TABS
1000.0000 mg | ORAL_TABLET | Freq: Three times a day (TID) | ORAL | 0 refills | Status: AC
Start: 2021-10-19 — End: 2021-11-02

## 2021-10-19 MED ORDER — SODIUM CHLORIDE 0.9 % IV BOLUS: 1000.0000 mL | Freq: Once | INTRAVENOUS | Status: DC

## 2021-10-19 MED ORDER — SULFAMETHOXAZOLE-TRIMETHOPRIM 800-160 MG PO TABS: 1.0000 | ORAL_TABLET | Freq: Two times a day (BID) | ORAL | 0 refills | Status: AC

## 2021-10-19 MED ORDER — SULFAMETHOXAZOLE-TRIMETHOPRIM 800-160 MG PO TABS
1.0000 | ORAL_TABLET | Freq: Once | ORAL | Status: AC
  Administered 2021-10-19: 1 via ORAL
  Filled 2021-10-19: qty 1

## 2021-10-19 NOTE — Discharge Instructions (Signed)
You were seen in the emergency department for skin/rash.  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  Also take Bactrim twice daily.  This is an antibiotic that also help with the infection.  You can take Tylenol Motrin for pain, return to the ED for fevers, difficulty swallowing, vision changes, new concerning symptoms.  I put in a referral for dermatology, they should schedule an appointment with you either tomorrow or Monday if you do not hear from them call and schedule an appointment yourself.

## 2021-10-19 NOTE — ED Triage Notes (Signed)
Patient has hx of eczema and right eye is swollen shut and draining purulent drainage along with lesions on his forehead.

## 2021-10-19 NOTE — ED Provider Notes (Signed)
[facility] EMERGENCY DEPARTMENT Provider Note   CSN: [id number] Arrival date & time: 10/19/21  1349     History  Chief Complaint  Patient presents with   Cellulitis    Bradley Maynard is a 26 y.o. male.  HPI   Patient with medical history of severe eczema, asthma, allergic rhinitis presents due to skin changes.  Symptoms started 4 days ago initially on his face and forehead.  He is also having on his neck, dorsums of his hand.  The rash is not painful, not pruritic.  There has been clear drainage from his forehead as well as some swelling to his eyes.  Denies any vision changes, allergic exposures, change in recent medications, history of autoimmune disorders, fevers, chills, difficulty swallowing.  He has tried topical steroid creams without any alleviation of his symptoms.  States he has a history of eczema, has been seen by dermatology for about 2 years.   Home Medications Prior to Admission medications   Medication Sig Start Date End Date Taking? Authorizing Provider  sulfamethoxazole-trimethoprim (BACTRIM DS) 800-160 MG tablet Take 1 tablet by mouth 2 (two) times daily for 7 days. 10/19/21 10/26/21 Yes Joslyne Marshburn, Hildred Alamin, PA-C  valACYclovir (VALTREX) 1000 MG tablet Take 1 tablet (1,000 mg total) by mouth 3 (three) times daily for 14 days. 10/19/21 11/02/21 Yes Sherrill Raring, PA-C  albuterol (VENTOLIN HFA) 108 (90 Base) MCG/ACT inhaler Inhale 2 puffs into the lungs every 6 (six) hours as needed for wheezing or shortness of breath. 10/23/18   [name], MD  betamethasone dipropionate (DIPROLENE) 0.05 % ointment Apply topically 2 (two) times daily. 01/12/19   Forrest Moron, MD  Emollient (CERAVE) LOTN Apply 1 application topically 2 (two) times daily. 12/22/18   Forrest Moron, MD  EPINEPHrine (EPIPEN) 0.3 mg/0.3 mL IJ SOAJ injection Inject 0.3 mLs (0.3 mg total) into the muscle once for 1 dose. 10/23/18 10/23/18  [name], MD  fluticasone (FLOVENT HFA) 110 MCG/ACT  inhaler Inhale 1 puff into the lungs 2 (two) times daily. 11/21/18   Forrest Moron, MD  loratadine (CLARITIN REDITABS) 10 MG dissolvable tablet Take 1 tablet (10 mg total) by mouth daily. 12/22/18   Forrest Moron, MD  montelukast (SINGULAIR) 10 MG tablet Take 1 tablet (10 mg total) by mouth at bedtime. 11/21/18   Forrest Moron, MD  triamcinolone cream (KENALOG) 0.1 % Apply 1 application topically 2 (two) times daily. 12/22/18   Forrest Moron, MD      Allergies    Milk-related compounds    Review of Systems   Review of Systems  Skin:  Positive for rash.    Physical Exam Updated Vital Signs BP (!) 132/99 (BP Location: Right Arm)   Pulse 83   Temp 99.1 F (37.3 C) (Oral)   Resp 18   Ht 5\' 9"  (1.753 m)   Wt 81.6 kg   SpO2 100%   BMI 26.58 kg/m  Physical Exam Vitals and nursing note reviewed. Exam conducted with a chaperone present.  Constitutional:      Appearance: Normal appearance.  HENT:     Head: Normocephalic and atraumatic.     Mouth/Throat:     Mouth: Mucous membranes are moist.  Eyes:     General: No scleral icterus.       Right eye: No discharge.        Left eye: No discharge.     Extraocular Movements: Extraocular movements intact.  Pupils: Pupils are equal, round, and reactive to light.     Comments: EOMI, visual fields grossly intact.  Cardiovascular:     Rate and Rhythm: Normal rate and regular rhythm.     Pulses: Normal pulses.     Heart sounds: Normal heart sounds. No murmur heard.    No friction rub. No gallop.  Pulmonary:     Effort: Pulmonary effort is normal. No respiratory distress.     Breath sounds: Normal breath sounds.  Abdominal:     General: Abdomen is flat. Bowel sounds are normal. There is no distension.     Palpations: Abdomen is soft.     Tenderness: There is no abdominal tenderness.  Musculoskeletal:     Cervical back: Normal range of motion. No tenderness.  Skin:    General: Skin is warm and dry.     Coloration: Skin is  not jaundiced.     Findings: Rash present.     Comments: See photos, no mucosal involvement.  Neurological:     Mental Status: He is alert. Mental status is at baseline.     Coordination: Coordination normal.      ED Results / Procedures / Treatments   Labs (all labs ordered are listed, but only abnormal results are displayed) Labs Reviewed  CBC WITH DIFFERENTIAL/PLATELET - Abnormal; Notable for the following components:      Result Value   RBC 6.11 (*)    Eosinophils Absolute 0.7 (*)    All other components within normal limits  COMPREHENSIVE METABOLIC PANEL - Abnormal; Notable for the following components:   Total Bilirubin 1.3 (*)    All other components within normal limits  LACTIC ACID, PLASMA - Abnormal; Notable for the following components:   Lactic Acid, Venous 2.2 (*)    All other components within normal limits  CULTURE, BLOOD (ROUTINE X 2)  CULTURE, BLOOD (ROUTINE X 2)  LACTIC ACID, PLASMA    EKG None  Radiology No results found.  Procedures Procedures    Medications Ordered in ED Medications  sulfamethoxazole-trimethoprim (BACTRIM DS) 800-160 MG per tablet 1 tablet (has no administration in time range)  valACYclovir (VALTREX) tablet 1,000 mg (has no administration in time range)    ED Course/ Medical Decision Making/ A&P                           Medical Decision Making Amount and/or Complexity of Data Reviewed Labs: ordered.  Risk Prescription drug management.   Patient presents due to rash.  Differential includes but not limited to eczema herpeticum, severe eczema, sepsis, cellulitis, impetigo, SJS/TN.  Patient is resting consistent with herpetic eczema.  There is no mucosal involvement, does not appear to have SJS or TE N.  Patient is not septic, does not technically meet SIRS criteria but is mildly to elevated temperature at 99.1 and pulse rate is slightly elevated in the 90s.  Discussed with my attending also evaluated patient.  Given  borderline SIRS criteria we will proceed with laboratory work-up and check lactic and blood culture.  I ordered and viewed laboratory work-up.  No leukocytosis, not anemic.  No transaminitis, AKI or electrolyte derangement.  Lactic is elevated mildly at 2.2.  I engaged in shared decision-making with the patient.  I do think he is appropriate for outpatient trial of antibiotics and valacyclovir.  However, given lactic acid is slightly elevated I offered fluids and recheck lactic to make sure it is downtrending.  Patient states  he does not want to be stuck again, he states he has not eaten or drinking all day and is requesting food and water.  Patient is not having systemic symptoms, there is no signs of airway involvement or ocular involvement.  He is afebrile, does not have a leukocytosis and I do think it is reasonable to try close follow-up with strict return precautions and absence of fluids and rechecking lactic.   I ordered valacyclovir and Bactrim for the patient.  Meds sent to pharmacy, I put in ambulatory referral for dermatology and also provided resources.  We discussed very strict return precautions, he verbalized understanding.   Discussed HPI, physical exam and plan of care for this patient with attending [facility]. The attending physician evaluated this patient as part of a shared visit and agrees with plan of care.         Final Clinical Impression(s) / ED Diagnoses Final diagnoses:  Eczema herpeticum    Rx / DC Orders ED Discharge Orders          Ordered    Ambulatory referral to Dermatology       Comments: Severe  herpetic eczema   10/19/21 1750    sulfamethoxazole-trimethoprim (BACTRIM DS) 800-160 MG tablet  2 times daily        10/19/21 1753    valACYclovir (VALTREX) 1000 MG tablet  3 times daily        10/19/21 1753              Theron Arista, PA-C 10/19/21 1850    Glyn Ade, MD 10/19/21 2257

## 2021-10-19 NOTE — ED Provider Triage Note (Signed)
Emergency Medicine Provider Triage Evaluation Note  Bradley Maynard , a 26 y.o. male  was evaluated in triage.  Pt complains of severe eczema to the face. Lesions on the face have been draining yellow fluid for several days. Having difficulty opening his right eye due to swelling and drainage.   Review of Systems  Positive: rash Negative: fever  Physical Exam  BP (!) 166/112 (BP Location: Right Arm)   Pulse 96   Temp 99.2 F (37.3 C) (Oral)   Resp 16   Ht 5\' 9"  (1.753 m)   Wt 81.6 kg   SpO2 100%   BMI 26.58 kg/m  Gen:   Awake, no distress   Resp:  Normal effort  MSK:   Moves extremities without difficulty  Other:  Significant erythema and eczematic lesions all over the face and bilateral arms. Draining both purulent and serosanguinous fluid over the forehead. Swollen right eyelid, difficult to assess conjunctiva   Medical Decision Making  Medically screening exam initiated at 2:15 PM.  Appropriate orders placed.  Bradley Maynard was informed that the remainder of the evaluation will be completed by another provider, this initial triage assessment does not replace that evaluation, and the importance of remaining in the ED until their evaluation is complete.     Delford Field, PA-C 10/19/21 1417

## 2021-10-24 LAB — CULTURE, BLOOD (ROUTINE X 2)
Culture: NO GROWTH
Culture: NO GROWTH
Special Requests: ADEQUATE
Special Requests: ADEQUATE

## 2021-10-25 ENCOUNTER — Encounter: Payer: Self-pay | Admitting: Dermatology

## 2021-10-25 ENCOUNTER — Ambulatory Visit (INDEPENDENT_AMBULATORY_CARE_PROVIDER_SITE_OTHER): Payer: Self-pay | Admitting: Dermatology

## 2021-10-25 DIAGNOSIS — Z79899 Other long term (current) drug therapy: Secondary | ICD-10-CM

## 2021-10-25 DIAGNOSIS — L309 Dermatitis, unspecified: Secondary | ICD-10-CM

## 2021-10-25 MED ORDER — DUPILUMAB 300 MG/2ML ~~LOC~~ SOAJ
600.0000 mg | Freq: Once | SUBCUTANEOUS | Status: AC
  Administered 2021-10-25: 600 mg via SUBCUTANEOUS

## 2021-10-25 MED ORDER — DUPILUMAB 300 MG/2ML ~~LOC~~ SOSY: 300.0000 mg | PREFILLED_SYRINGE | Freq: Once | SUBCUTANEOUS | Status: DC

## 2021-10-25 MED ORDER — HYDROXYZINE HCL 25 MG PO TABS: 25.0000 mg | ORAL_TABLET | Freq: Four times a day (QID) | ORAL | 0 refills | Status: AC | PRN

## 2021-10-25 MED ORDER — CEPHALEXIN 500 MG PO CAPS: 500.0000 mg | ORAL_CAPSULE | Freq: Three times a day (TID) | ORAL | 0 refills | Status: AC

## 2021-10-25 MED ORDER — MUPIROCIN 2 % EX OINT: 1.0000 | TOPICAL_OINTMENT | Freq: Every day | CUTANEOUS | 0 refills | Status: AC

## 2021-10-25 MED ORDER — HYDROCORTISONE 2.5 % EX OINT: TOPICAL_OINTMENT | Freq: Two times a day (BID) | CUTANEOUS | 0 refills | Status: DC

## 2021-10-25 MED ORDER — TRIAMCINOLONE ACETONIDE 0.1 % EX OINT: 1.0000 | TOPICAL_OINTMENT | Freq: Two times a day (BID) | CUTANEOUS | 0 refills | Status: DC | PRN

## 2021-10-25 NOTE — Progress Notes (Signed)
New Patient Visit  Subjective  Bradley Maynard is a 26 y.o. male who presents for the following: New Patient (Initial Visit) (Eczema many years  topicals isnt helping. Had recent flare at face, neck, arms, and hands. Prescribed bactrim and valycyclovir to use. ).  No vision change, no pain in eyes, just crusting at eyelids  Reviewed hospital records   The following portions of the chart were reviewed this encounter and updated as appropriate:   Tobacco  Allergies  Meds  Problems  Med Hx  Surg Hx  Fam Hx      Review of Systems:  No other skin or systemic complaints except as noted in HPI or Assessment and Plan.  Objective  Well appearing patient in no apparent distress; mood and affect are within normal limits.  A focused examination was performed including face, neck, chest, back, arms, legs. Relevant physical exam findings are noted in the Assessment and Plan.  face, forehead, neck, arms Widespread scaly pink papules coalescing to plaques with yellow crusting at lips >80% BSA     Assessment & Plan  Eczema, unspecified type face, forehead, neck, arms  Chronic and persistent condition with duration over one year. Condition is bothersome/symptomatic for patient. Currently flared with superinfection  80% + bsa  Atopic dermatitis - Severe, patient needs Dupixent or other systemic therapy Atopic dermatitis (eczema) is a chronic, relapsing, pruritic condition that can significantly affect quality of life. It is often associated with allergic rhinitis and/or asthma and can require treatment with topical medications, phototherapy, or in severe cases a biologic medication called Dupixent, which requires long term medication management.   Start Cephelaxin 500 by mouth tid for 7 days Start mupirocin ointment apply topically to qd for open areas and yellow crusting Start Hydrocortisone 2.5 % ointment use apply bid to affected areas for up to 2 weeks at face  Start Triamcinolone  ointment twice a day as needed Avoid applying to face, groin, and axilla. Use as directed. Long-term use can cause thinning of the skin.  Use saran wrap or moist clothes to wear to bed once a day for 2-3 days to more rapidly calm inflammation  Ok to take bath -  soak 10 - 15 minutes   Start Dupixent  Recommend stopping valacyclovir. No signs of eczema herpeticum and he has been adequately treated Finish bactrim   For any eye pain or vision changes, he should see ophthalmology immediately.  cephALEXin (KEFLEX) 500 MG capsule - face, forehead, neck, arms Take 1 capsule (500 mg total) by mouth 3 (three) times daily for 7 days.  mupirocin ointment (BACTROBAN) 2 % - face, forehead, neck, arms Apply 1 Application topically daily. Apply to any open areas at face and body  triamcinolone ointment (KENALOG) 0.1 % - face, forehead, neck, arms Apply 1 Application topically 2 (two) times daily as needed (Rash). Use as directed. Refer to patient handout  hydrOXYzine (ATARAX) 25 MG tablet - face, forehead, neck, arms Take 1 tablet (25 mg total) by mouth every 6 (six) hours as needed. Caution sedation/drowsiness  Dupilumab SOPN 600 mg - face, forehead, neck, arms   Related Medications hydrocortisone 2.5 % ointment Apply topically 2 (two) times daily. Use as directed. Refer to patient hand out   Return in about 2 weeks (around 11/08/2021) for follow up on eczema .  I, Asher Muir, CMA, am acting as scribe for Darden Dates, MD.  Documentation: I have reviewed the above documentation for accuracy and completeness, and I agree  with the above.  Forest Gleason, MD

## 2021-10-25 NOTE — Patient Instructions (Addendum)
Discontinue Valacyclovir. Finish bactrim.  Start cephalexin 500 mg capsule by mouth three times daily for 7 days Start Triamcinolone ointment 0.1 % - apply topically twice daily to affected areas of body avoid face, axilla, groin.  Start Hydrocortisone 2.5 % cream - apply topically twice daily to affected areas at face and folds.  Start Mupirocin Ointment - apply topically around mouth twice a day until no more open areas and crusting.  Take hydroxyzine tablet every 6-8 hours as needed for itch. Causes drowsiness.  If you develop any eye pain or vision changes, see ophthalmologist or go to emergency department immediately.   For Bleach Baths  Add 1/4 cup to 1/2 cup of bleach to a 40-gallon standard bathtub filled with warm water.   Dr. Neale Burly Pager: [phone]    Topical steroids (such as triamcinolone, fluocinolone, fluocinonide, mometasone, clobetasol, halobetasol, betamethasone, hydrocortisone) can cause thinning and lightening of the skin if they are used for too long in the same area. Your physician has selected the right strength medicine for your problem and area affected on the body. Please use your medication only as directed by your physician to prevent side effects.    Due to recent changes in healthcare laws, you may see results of your pathology and/or laboratory studies on MyChart before the doctors have had a chance to review them. We understand that in some cases there may be results that are confusing or concerning to you. Please understand that not all results are received at the same time and often the doctors may need to interpret multiple results in order to provide you with the best plan of care or course of treatment. Therefore, we ask that you please give [place] 2 business days to thoroughly review all your results before contacting the office for clarification. Should we see a critical lab result, you will be contacted sooner.   If You Need Anything After Your  Visit  If you have any questions or concerns for your doctor, please call our main line at [phone] and press option 4 to reach your doctor's medical assistant. If no one answers, please leave a voicemail as directed and we will return your call as soon as possible. Messages left after 4 pm will be answered the following business day.   You may also send [place] a message via MyChart. We typically respond to MyChart messages within 1-2 business days.  For prescription refills, please ask your pharmacy to contact our office. Our fax number is [phone].  If you have an urgent issue when the clinic is closed that cannot wait until the next business day, you can page your doctor at the number below.    Please note that while we do our best to be available for urgent issues outside of office hours, we are not available 24/7.   If you have an urgent issue and are unable to reach [place], you may choose to seek medical care at your doctor's office, retail clinic, urgent care center, or emergency room.  If you have a medical emergency, please immediately call 911 or go to the emergency department.  Pager Numbers  - Dr. Gwen Pounds: [phone]  - Dr. Neale Burly: [phone]  - Dr. Roseanne Reno: [phone]  In the event of inclement weather, please call our main line at [phone] for an update on the status of any delays or closures.  Dermatology Medication Tips: Please keep the boxes that topical medications come in in order to help keep track of the instructions about where  and how to use these. Pharmacies typically print the medication instructions only on the boxes and not directly on the medication tubes.   If your medication is too expensive, please contact our office at [phone] option 4 or send [place] a message through [place].   We are unable to tell what your co-pay for medications will be in advance as this is different depending on your insurance coverage. However, we may be able to find a  substitute medication at lower cost or fill out paperwork to get insurance to cover a needed medication.   If a prior authorization is required to get your medication covered by your insurance company, please allow [place] 1-2 business days to complete this process.  Drug prices often vary depending on where the prescription is filled and some pharmacies may offer cheaper prices.  The website www.goodrx.com contains coupons for medications through different pharmacies. The prices here do not account for what the cost may be with help from insurance (it may be cheaper with your insurance), but the website can give you the price if you did not use any insurance.  - You can print the associated coupon and take it with your prescription to the pharmacy.  - You may also stop by our office during regular business hours and pick up a GoodRx coupon card.  - If you need your prescription sent electronically to a different pharmacy, notify our office through [facility] or by phone at [phone] option 4.     Si Usted Necesita Algo Despus de Su Visita  Tambin puede enviarnos un mensaje a travs de Pharmacist, community. Por lo general respondemos a los mensajes de MyChart en el transcurso de 1 a 2 das hbiles.  Para renovar recetas, por favor pida a su farmacia que se ponga en contacto con nuestra oficina. Harland Dingwall de fax es [place] [phone].  Si tiene un asunto urgente cuando la clnica est cerrada y que no puede esperar hasta el siguiente da hbil, puede llamar/localizar a su doctor(a) al nmero que aparece a continuacin.   Por favor, tenga en cuenta que aunque hacemos todo lo posible para estar disponibles para asuntos urgentes fuera del horario de [place], no estamos disponibles las 24 horas del da, los 7 das de la [place].   Si tiene un problema urgente y no puede comunicarse con nosotros, puede optar por buscar atencin mdica  en el consultorio de su doctor(a), en una clnica privada, en un  centro de atencin urgente o en una sala de emergencias.  Si tiene Engineering geologist, por favor llame inmediatamente al 911 o vaya a la sala de emergencias.  Nmeros de bper  - Dr. Nehemiah Massed: [phone]  - Dra. Moye: [phone]  - Dra. Nicole Kindred: [phone]  En caso de inclemencias del [place], por favor llame a Johnsie Kindred principal al [phone] para una actualizacin sobre el [place] de cualquier retraso o cierre.  Consejos para la medicacin en dermatologa: Por favor, guarde las cajas en las que vienen los medicamentos de uso tpico para ayudarle a seguir las instrucciones sobre dnde y cmo usarlos. Las farmacias generalmente imprimen las instrucciones del medicamento slo en las cajas y no directamente en los tubos del [place].   Si su medicamento es muy caro, por favor, pngase en contacto con Zigmund Daniel llamando al [phone] y presione la opcin 4 o envenos un mensaje a travs de Pharmacist, community.   No podemos decirle cul ser su copago por los medicamentos por adelantado ya que esto es  diferente dependiendo de la cobertura de su seguro. Sin embargo, es posible que podamos encontrar un medicamento sustituto a Electrical engineer un formulario para que el seguro cubra el medicamento que se considera necesario.   Si se requiere una autorizacin previa para que su compaa de seguros [place] su medicamento, por favor permtanos de 1 a 2 das hbiles para completar este proceso.  Los precios de los medicamentos varan con frecuencia dependiendo del Environmental consultant de dnde se surte la receta y alguna farmacias pueden ofrecer precios ms baratos.  El sitio web www.goodrx.com tiene cupones para medicamentos de Airline pilot. Los precios aqu no tienen en cuenta lo que podra costar con la ayuda del seguro (puede ser ms barato con su seguro), pero el sitio web puede darle el precio si no utiliz Research scientist (physical sciences).  - Puede imprimir el cupn correspondiente y llevarlo con su receta  a la farmacia.  - Tambin puede pasar por nuestra oficina durante el horario de atencin regular y Charity fundraiser una tarjeta de cupones de GoodRx.  - Si necesita que su receta se enve electrnicamente a una farmacia diferente, informe a nuestra oficina a travs de MyChart de [place] o por telfono llamando al [phone] y presione la opcin 4.

## 2021-11-09 ENCOUNTER — Ambulatory Visit: Payer: Self-pay | Admitting: Dermatology

## 2021-11-09 ENCOUNTER — Telehealth: Payer: Self-pay

## 2021-11-09 NOTE — Telephone Encounter (Signed)
Fax received from [facility] stating that they have been unable to reach this patient to schedule their next delivery of [place]. Called patient and left a VM with [employer]'s number for them to to call to schedule delivery. Number is [phone].

## 2021-11-28 ENCOUNTER — Ambulatory Visit (INDEPENDENT_AMBULATORY_CARE_PROVIDER_SITE_OTHER): Payer: Self-pay | Admitting: Dermatology

## 2021-11-28 DIAGNOSIS — L209 Atopic dermatitis, unspecified: Secondary | ICD-10-CM

## 2021-11-28 DIAGNOSIS — Z79899 Other long term (current) drug therapy: Secondary | ICD-10-CM

## 2021-11-28 MED ORDER — DUPILUMAB 300 MG/2ML ~~LOC~~ SOSY
300.0000 mg | PREFILLED_SYRINGE | Freq: Once | SUBCUTANEOUS | Status: AC
  Administered 2021-11-28: 300 mg via SUBCUTANEOUS

## 2021-11-28 NOTE — Progress Notes (Signed)
   Follow-Up Visit   Subjective  Bradley Maynard is a 26 y.o. male who presents for the following: Eczema (Condition has improved since the last visit. He has finished the Cephalexin and continued the TMC 0.1% and HC 2.5%. He needs a refill on the HC 2.5%. No issues after having Dupixent injection. Bleach baths help with itching).   The following portions of the chart were reviewed this encounter and updated as appropriate:   Tobacco  Allergies  Meds  Problems  Med Hx  Surg Hx  Fam Hx      Review of Systems:  No other skin or systemic complaints except as noted in HPI or Assessment and Plan.  Objective  Well appearing patient in no apparent distress; mood and affect are within normal limits.  A focused examination was performed including the face and extremities. Relevant physical exam findings are noted in the Assessment and Plan.  Face, trunk, extremities Widespread pink and hyperpigmented scaly plaques    Assessment & Plan  Atopic dermatitis, unspecified type Face, trunk, extremities  Chronic and persistent condition with duration or expected duration over one year. Condition is bothersome/symptomatic for patient. Currently flared but improved.  Atopic dermatitis (eczema) is a chronic, relapsing, pruritic condition that can significantly affect quality of life. It is often associated with allergic rhinitis and/or asthma and can require treatment with topical medications, phototherapy, or in severe cases biologic injectable medication (Dupixent; Adbry) or Oral JAK inhibitors.  Continue bleach baths.   Start Coconut oil after baths since patient experiences itching with Vaseline and Aquaphor.   Continue Dupixent injections SQ Q2W. Dupilumab (Dupixent) is a treatment given by injection for adults and children with moderate-to-severe atopic dermatitis. Goal is control of skin condition, not cure. It is given as 2 injections at the first dose followed by 1 injection ever 2  weeks thereafter.  Young children are dosed monthly.  Potential side effects include allergic reaction, herpes infections, injection site reactions and conjunctivitis (inflammation of the eyes).  The use of Dupixent requires long term medication management, including periodic office visits.  Continue TMC 0.1% ointment to aa's QD-BID PRN only for body areas that are significantly bothersome. Reviewed risk of atrophy, striae. Topical steroids (such as triamcinolone, fluocinolone, fluocinonide, mometasone, clobetasol, halobetasol, betamethasone, hydrocortisone) can cause thinning and lightening of the skin if they are used for too long in the same area. Your physician has selected the right strength medicine for your problem and area affected on the body. Please use your medication only as directed by your physician to prevent side effects.   Will give patient Opzelura samples to use twice a day to face and most bothersome body areas when available.    Samples given of Eucrisa 2% ointment apply to aa's QD PRN.   dupilumab (DUPIXENT) prefilled syringe 300 mg - Face, trunk, extremities    Return in about 2 weeks (around 12/12/2021) for Dupixent injection with nurse; 4 weeks to 3 mths for follow up with Dr. Laurence Ferrari.  Luther Redo, CMA, am acting as scribe for Forest Gleason, MD .  Documentation: I have reviewed the above documentation for accuracy and completeness, and I agree with the above.  Forest Gleason, MD

## 2021-11-28 NOTE — Patient Instructions (Signed)
Dupilumab (Dupixent) is a treatment given by injection for adults and children with moderate-to-severe atopic dermatitis. Goal is control of skin condition, not cure. It is given as 2 injections at the first dose followed by 1 injection ever 2 weeks thereafter.  Young children are dosed monthly.  Potential side effects include allergic reaction, herpes infections, injection site reactions and conjunctivitis (inflammation of the eyes).  The use of Dupixent requires long term medication management, including periodic office visits.    Due to recent changes in healthcare laws, you may see results of your pathology and/or laboratory studies on MyChart before the doctors have had a chance to review them. We understand that in some cases there may be results that are confusing or concerning to you. Please understand that not all results are received at the same time and often the doctors may need to interpret multiple results in order to provide you with the best plan of care or course of treatment. Therefore, we ask that you please give us 2 business days to thoroughly review all your results before contacting the office for clarification. Should we see a critical lab result, you will be contacted sooner.   If You Need Anything After Your Visit  If you have any questions or concerns for your doctor, please call our main line at [phone] and press option 4 to reach your doctor's medical assistant. If no one answers, please leave a voicemail as directed and we will return your call as soon as possible. Messages left after 4 pm will be answered the following business day.   You may also send us a message via MyChart. We typically respond to MyChart messages within 1-2 business days.  For prescription refills, please ask your pharmacy to contact our office. Our fax number is [phone].  If you have an urgent issue when the clinic is closed that cannot wait until the next business day, you can page  your doctor at the number below.    Please note that while we do our best to be available for urgent issues outside of office hours, we are not available 24/7.   If you have an urgent issue and are unable to reach us, you may choose to seek medical care at your doctor's office, retail clinic, urgent care center, or emergency room.  If you have a medical emergency, please immediately call 911 or go to the emergency department.  Pager Numbers  - Dr. Kowalski: [phone]  - Dr. Moye: [phone]  - Dr. Stewart: [phone]  In the event of inclement weather, please call our main line at [phone] for an update on the status of any delays or closures.  Dermatology Medication Tips: Please keep the boxes that topical medications come in in order to help keep track of the instructions about where and how to use these. Pharmacies typically print the medication instructions only on the boxes and not directly on the medication tubes.   If your medication is too expensive, please contact our office at [phone] option 4 or send us a message through MyChart.   We are unable to tell what your co-pay for medications will be in advance as this is different depending on your insurance coverage. However, we may be able to find a substitute medication at lower cost or fill out paperwork to get insurance to cover a needed medication.   If a prior authorization is required to get your medication covered by your insurance company, please allow us 1-2 business days to   complete this process.  Drug prices often vary depending on where the prescription is filled and some pharmacies may offer cheaper prices.  The website www.goodrx.com contains coupons for medications through different pharmacies. The prices here do not account for what the cost may be with help from insurance (it may be cheaper with your insurance), but the website can give you the price if you did not use any insurance.  - You can  print the associated coupon and take it with your prescription to the pharmacy.  - You may also stop by our office during regular business hours and pick up a GoodRx coupon card.  - If you need your prescription sent electronically to a different pharmacy, notify our office through [place] MyChart or by phone at [phone] option 4.     Si Usted Necesita Algo Despus de Su Visita  Tambin puede enviarnos un mensaje a travs de MyChart. Por lo general respondemos a los mensajes de MyChart en el transcurso de 1 a 2 das hbiles.  Para renovar recetas, por favor pida a su farmacia que se ponga en contacto con nuestra oficina. Nuestro nmero de fax es el [phone].  Si tiene un asunto urgente cuando la clnica est cerrada y que no puede esperar hasta el siguiente da hbil, puede llamar/localizar a su doctor(a) al nmero que aparece a continuacin.   Por favor, tenga en cuenta que aunque hacemos todo lo posible para estar disponibles para asuntos urgentes fuera del horario de oficina, no estamos disponibles las 24 horas del da, los 7 das de la semana.   Si tiene un problema urgente y no puede comunicarse con nosotros, puede optar por buscar atencin mdica  en el consultorio de su doctor(a), en una clnica privada, en un centro de atencin urgente o en una sala de emergencias.  Si tiene una emergencia mdica, por favor llame inmediatamente al 911 o vaya a la sala de emergencias.  Nmeros de bper  - Dr. Kowalski: [phone]  - Dra. Moye: [phone]  - Dra. Stewart: [phone]  En caso de inclemencias del tiempo, por favor llame a nuestra lnea principal al [phone] para una actualizacin sobre el estado de cualquier retraso o cierre.  Consejos para la medicacin en dermatologa: Por favor, guarde las cajas en las que vienen los medicamentos de uso tpico para ayudarle a seguir las instrucciones sobre dnde y cmo usarlos. Las farmacias generalmente imprimen las  instrucciones del medicamento slo en las cajas y no directamente en los tubos del medicamento.   Si su medicamento es muy caro, por favor, pngase en contacto con nuestra oficina llamando al [phone] y presione la opcin 4 o envenos un mensaje a travs de MyChart.   No podemos decirle cul ser su copago por los medicamentos por adelantado ya que esto es diferente dependiendo de la cobertura de su seguro. Sin embargo, es posible que podamos encontrar un medicamento sustituto a menor costo o llenar un formulario para que el seguro cubra el medicamento que se considera necesario.   Si se requiere una autorizacin previa para que su compaa de seguros cubra su medicamento, por favor permtanos de 1 a 2 das hbiles para completar este proceso.  Los precios de los medicamentos varan con frecuencia dependiendo del lugar de dnde se surte la receta y alguna farmacias pueden ofrecer precios ms baratos.  El sitio web www.goodrx.com tiene cupones para medicamentos de diferentes farmacias. Los precios aqu no tienen en cuenta lo que podra costar con la ayuda del   seguro (puede ser ms barato con su seguro), pero el sitio web puede darle el precio si no utiliz ningn seguro.  - Puede imprimir el cupn correspondiente y llevarlo con su receta a la farmacia.  - Tambin puede pasar por nuestra oficina durante el horario de atencin regular y recoger una tarjeta de cupones de GoodRx.  - Si necesita que su receta se enve electrnicamente a una farmacia diferente, informe a nuestra oficina a travs de MyChart de [place] o por telfono llamando al [phone] y presione la opcin 4.  

## 2021-11-30 ENCOUNTER — Telehealth: Payer: Self-pay

## 2021-11-30 NOTE — Telephone Encounter (Signed)
LMOVM Opzelura samples will be at front desk. JP

## 2021-12-01 ENCOUNTER — Encounter: Payer: Self-pay | Admitting: Dermatology

## 2021-12-12 ENCOUNTER — Ambulatory Visit: Payer: Self-pay

## 2022-01-17 ENCOUNTER — Ambulatory Visit: Payer: Self-pay | Admitting: Dermatology

## 2022-02-01 ENCOUNTER — Ambulatory Visit: Payer: Self-pay | Admitting: Dermatology

## 2022-03-20 ENCOUNTER — Encounter: Payer: Self-pay | Admitting: Dermatology

## 2022-03-20 ENCOUNTER — Ambulatory Visit (INDEPENDENT_AMBULATORY_CARE_PROVIDER_SITE_OTHER): Payer: Self-pay | Admitting: Dermatology

## 2022-03-20 VITALS — BP 143/86 | HR 90

## 2022-03-20 DIAGNOSIS — L209 Atopic dermatitis, unspecified: Secondary | ICD-10-CM

## 2022-03-20 DIAGNOSIS — L299 Pruritus, unspecified: Secondary | ICD-10-CM

## 2022-03-20 DIAGNOSIS — L309 Dermatitis, unspecified: Secondary | ICD-10-CM

## 2022-03-20 DIAGNOSIS — L659 Nonscarring hair loss, unspecified: Secondary | ICD-10-CM

## 2022-03-20 DIAGNOSIS — L639 Alopecia areata, unspecified: Secondary | ICD-10-CM

## 2022-03-20 MED ORDER — TRIAMCINOLONE ACETONIDE 0.1 % EX OINT: TOPICAL_OINTMENT | CUTANEOUS | 2 refills | Status: DC

## 2022-03-20 MED ORDER — DUPIXENT 300 MG/2ML ~~LOC~~ SOSY: 600.0000 mg | PREFILLED_SYRINGE | Freq: Once | SUBCUTANEOUS | 0 refills | Status: AC

## 2022-03-20 MED ORDER — HYDROCORTISONE 2.5 % EX OINT: TOPICAL_OINTMENT | CUTANEOUS | 2 refills | Status: DC

## 2022-03-20 NOTE — Patient Instructions (Addendum)
Start Opzelura cream twice daily to affected areas on face and eyebrows.  Start Triamcinolone 0.1% ointment twice daily to affected body areas up to 2 weeks as needed. Avoid applying to face, groin, and axilla. Use as directed. Long-term use can cause thinning of the skin.  Start Hydrocortisone 2.5% ointment twice daily to affected areas on face 1-2 weeks as needed.     Topical steroids (such as triamcinolone, fluocinolone, fluocinonide, mometasone, clobetasol, halobetasol, betamethasone, hydrocortisone) can cause thinning and lightening of the skin if they are used for too long in the same area. Your physician has selected the right strength medicine for your problem and area affected on the body. Please use your medication only as directed by your physician to prevent side effects.    Start Dupixent 300mg  pen: inject contents of one pen, subcutaneously, once every 2 weeks.  Dupilumab (Dupixent) is a treatment given by injection for adults and children with moderate-to-severe atopic dermatitis. Goal is control of skin condition, not cure. It is given as 2 injections at the first dose followed by 1 injection ever 2 weeks thereafter.  Young children are dosed monthly.  Potential side effects include allergic reaction, herpes infections, injection site reactions and conjunctivitis (inflammation of the eyes).  The use of Dupixent requires long term medication management, including periodic office visits.    Due to recent changes in healthcare laws, you may see results of your pathology and/or laboratory studies on MyChart before the doctors have had a chance to review them. We understand that in some cases there may be results that are confusing or concerning to you. Please understand that not all results are received at the same time and often the doctors may need to interpret multiple results in order to provide you with the best plan of care or course of treatment. Therefore, we ask that you please  give [place] 2 business days to thoroughly review all your results before contacting the office for clarification. Should we see a critical lab result, you will be contacted sooner.   If You Need Anything After Your Visit  If you have any questions or concerns for your doctor, please call our main line at [phone] and press option 4 to reach your doctor's medical assistant. If no one answers, please leave a voicemail as directed and we will return your call as soon as possible. Messages left after 4 pm will be answered the following business day.   You may also send [place] a message via [place]. We typically respond to MyChart messages within 1-2 business days.  For prescription refills, please ask your pharmacy to contact our office. Our fax number is [phone].  If you have an urgent issue when the clinic is closed that cannot wait until the next business day, you can page your doctor at the number below.    Please note that while we do our best to be available for urgent issues outside of office hours, we are not available 24/7.   If you have an urgent issue and are unable to reach [place], you may choose to seek medical care at your doctor's office, retail clinic, urgent care center, or emergency room.  If you have a medical emergency, please immediately call 911 or go to the emergency department.  Pager Numbers  - Dr. Nehemiah Massed: [phone]  - Dr. Laurence Ferrari: [phone]  - Dr. Nicole Kindred: [phone]  In the event of inclement weather, please call our main line at [phone] for an update on the status of  any delays or closures.  Dermatology Medication Tips: Please keep the boxes that topical medications come in in order to help keep track of the instructions about where and how to use these. Pharmacies typically print the medication instructions only on the boxes and not directly on the medication tubes.   If your medication is too expensive, please contact our office at [phone]  option 4 or send [place] a message through [place].   We are unable to tell what your co-pay for medications will be in advance as this is different depending on your insurance coverage. However, we may be able to find a substitute medication at lower cost or fill out paperwork to get insurance to cover a needed medication.   If a prior authorization is required to get your medication covered by your insurance company, please allow [place] 1-2 business days to complete this process.  Drug prices often vary depending on where the prescription is filled and some pharmacies may offer cheaper prices.  The website www.goodrx.com contains coupons for medications through different pharmacies. The prices here do not account for what the cost may be with help from insurance (it may be cheaper with your insurance), but the website can give you the price if you did not use any insurance.  - You can print the associated coupon and take it with your prescription to the pharmacy.  - You may also stop by our office during regular business hours and pick up a GoodRx coupon card.  - If you need your prescription sent electronically to a different pharmacy, notify our office through [facility] or by phone at [phone] option 4.     Si Usted Necesita Algo Despus de Su Visita  Tambin puede enviarnos un mensaje a travs de Pharmacist, community. Por lo general respondemos a los mensajes de MyChart en el transcurso de 1 a 2 das hbiles.  Para renovar recetas, por favor pida a su farmacia que se ponga en contacto con nuestra oficina. Harland Dingwall de fax es [place] [phone].  Si tiene un asunto urgente cuando la clnica est cerrada y que no puede esperar hasta el siguiente da hbil, puede llamar/localizar a su doctor(a) al nmero que aparece a continuacin.   Por favor, tenga en cuenta que aunque hacemos todo lo posible para estar disponibles para asuntos urgentes fuera del horario de [place], no estamos disponibles las  24 horas del da, los 7 das de la [place].   Si tiene un problema urgente y no puede comunicarse con nosotros, puede optar por buscar atencin mdica  en el consultorio de su doctor(a), en una clnica privada, en un centro de atencin urgente o en una sala de emergencias.  Si tiene Engineering geologist, por favor llame inmediatamente al 911 o vaya a la sala de emergencias.  Nmeros de bper  - Dr. Nehemiah Massed: [phone]  - Dra. Moye: [phone]  - Dra. Nicole Kindred: [phone]  En caso de inclemencias del [place], por favor llame a Johnsie Kindred principal al [phone] para una actualizacin sobre el [place] de cualquier retraso o cierre.  Consejos para la medicacin en dermatologa: Por favor, guarde las cajas en las que vienen los medicamentos de uso tpico para ayudarle a seguir las instrucciones sobre dnde y cmo usarlos. Las farmacias generalmente imprimen las instrucciones del medicamento slo en las cajas y no directamente en los tubos del [place].   Si su medicamento es muy caro, por favor, pngase en contacto con Zigmund Daniel llamando al [phone] y presione la  opcin 4 o envenos un mensaje a travs de MyChart.   No podemos decirle cul ser su copago por los medicamentos por adelantado ya que esto es diferente dependiendo de la cobertura de su seguro. Sin embargo, es posible que podamos encontrar un medicamento sustituto a Electrical engineer un formulario para que el seguro cubra el medicamento que se considera necesario.   Si se requiere una autorizacin previa para que su compaa de seguros [place] su medicamento, por favor permtanos de 1 a 2 das hbiles para completar este proceso.  Los precios de los medicamentos varan con frecuencia dependiendo del Environmental consultant de dnde se surte la receta y alguna farmacias pueden ofrecer precios ms baratos.  El sitio web www.goodrx.com tiene cupones para medicamentos de Airline pilot. Los precios aqu no tienen en cuenta lo  que podra costar con la ayuda del seguro (puede ser ms barato con su seguro), pero el sitio web puede darle el precio si no utiliz Research scientist (physical sciences).  - Puede imprimir el cupn correspondiente y llevarlo con su receta a la farmacia.  - Tambin puede pasar por nuestra oficina durante el horario de atencin regular y Charity fundraiser una tarjeta de cupones de GoodRx.  - Si necesita que su receta se enve electrnicamente a una farmacia diferente, informe a nuestra oficina a travs de MyChart de [place] o por telfono llamando al [phone] y presione la opcin 4.

## 2022-03-20 NOTE — Progress Notes (Signed)
Follow-Up Visit   Subjective  Bradley Maynard is a 27 y.o. male who presents for the following: Eczema (2 month follow up. Dupixent loading dose 10/25/2021. Has not had Dupixent injection since last visit here 11/28/2021. Would like refills of HC ointment and Triamcinolone ointment. Did not want to do Dupixent injections here every 2 weeks. Would like to discuss other injectable medications ).  The following portions of the chart were reviewed this encounter and updated as appropriate:  Tobacco  Allergies  Meds  Problems  Med Hx  Surg Hx  Fam Hx      Review of Systems: No other skin or systemic complaints except as noted in HPI or Assessment and Plan.   Objective  Well appearing patient in no apparent distress; mood and affect are within normal limits.  A focused examination was performed including face, arms, neck. Relevant physical exam findings are noted in the Assessment and Plan.  face, arms, legs, torso Widespread scaly pink papules coalescing to plaques. >60% BSA  eyebrows Patchy areas of nonscarring hair loss.    Assessment & Plan  Atopic dermatitis, unspecified type face, arms, legs, torso  Chronic and persistent condition with duration or expected duration over one year. Condition is symptomatic/ bothersome to patient. Not currently at goal.  Atopic dermatitis (eczema) is a chronic, relapsing, pruritic condition that can significantly affect quality of life. It is often associated with allergic rhinitis and/or asthma and can require treatment with topical medications, phototherapy, or in severe cases biologic injectable medication (Dupixent; Adbry) or Oral JAK inhibitors.  Start Triamcinolone 0.1% ointment twice daily to affected body areas up to 2 weeks as needed. Avoid applying to face, groin, and axilla. Use as directed. Long-term use can cause thinning of the skin.  Start Hydrocortisone 2.5% ointment twice daily to affected areas on face 1-2 weeks as needed.    Topical steroids (such as triamcinolone, fluocinolone, fluocinonide, mometasone, clobetasol, halobetasol, betamethasone, hydrocortisone) can cause thinning and lightening of the skin if they are used for too long in the same area. Your physician has selected the right strength medicine for your problem and area affected on the body. Please use your medication only as directed by your physician to prevent side effects.    Dupixent loading dose given today.   Dupixent 300mg /50ml x2 pens injected today.  NDC: 1610-9604-54 Lot: [id number] Exp: 12/20/2023  Continue Dupixent 300 mg/72ml pen subcutaneous injection every 2 week.   Injection training performed today. Patient states he feels comfortable giving himself injections. Sample box given today.   Information given for patient to call for [place] delivery. Advised he is approved for free medication until 10/2022.   Related Medications dupilumab (DUPIXENT) 300 MG/2ML prefilled syringe Inject 600 mg into the skin once for 1 dose. On day 1.  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    Eczema, unspecified type  Related Medications mupirocin ointment (BACTROBAN) 2 % Apply 1 Application topically daily. Apply to any open areas at face and body  hydrOXYzine (ATARAX) 25 MG tablet Take 1 tablet (25 mg total) by mouth every 6 (six) hours as needed. Caution sedation/drowsiness  triamcinolone ointment (KENALOG) 0.1 % Apply twice daily to affected body areas up to 2 weeks as needed. Avoid applying to face, groin, and axilla.  hydrocortisone 2.5 % ointment Apply once or twice daily to face 1-2 weeks as needed only.  Itch Left  Upper Arm - Anterior  Alopecia Right Forehead  Favor alopecia areata vs from rubbing at eyebrows  Start opzelura twice a day   Return in about 2 months (around 05/19/2022) for Atopic  Dermatitis.  I, Emelia Salisbury, CMA, am acting as scribe for Forest Gleason, MD.  Documentation: I have reviewed the above documentation for accuracy and completeness, and I agree with the above.  Forest Gleason, MD

## 2022-05-16 ENCOUNTER — Ambulatory Visit: Payer: Self-pay | Admitting: Dermatology

## 2022-06-28 ENCOUNTER — Ambulatory Visit: Payer: Self-pay | Admitting: Dermatology

## 2022-07-05 ENCOUNTER — Encounter: Payer: Self-pay | Admitting: Dermatology

## 2022-07-05 ENCOUNTER — Ambulatory Visit (INDEPENDENT_AMBULATORY_CARE_PROVIDER_SITE_OTHER): Payer: Self-pay | Admitting: Dermatology

## 2022-07-05 VITALS — BP 150/93 | HR 75

## 2022-07-05 DIAGNOSIS — L309 Dermatitis, unspecified: Secondary | ICD-10-CM

## 2022-07-05 DIAGNOSIS — Z79899 Other long term (current) drug therapy: Secondary | ICD-10-CM

## 2022-07-05 DIAGNOSIS — L2089 Other atopic dermatitis: Secondary | ICD-10-CM

## 2022-07-05 DIAGNOSIS — L209 Atopic dermatitis, unspecified: Secondary | ICD-10-CM

## 2022-07-05 MED ORDER — DUPILUMAB 300 MG/2ML ~~LOC~~ SOAJ
600.0000 mg | Freq: Once | SUBCUTANEOUS | Status: AC
  Administered 2022-07-05: 600 mg via SUBCUTANEOUS

## 2022-07-05 MED ORDER — TRIAMCINOLONE ACETONIDE 0.1 % EX OINT: TOPICAL_OINTMENT | CUTANEOUS | 2 refills | Status: AC

## 2022-07-05 MED ORDER — HYDROCORTISONE 2.5 % EX OINT: TOPICAL_OINTMENT | CUTANEOUS | 2 refills | Status: AC

## 2022-07-05 NOTE — Patient Instructions (Addendum)
Continue Triamcinolone 0.1% ointment twice daily to affected body areas up to 2 weeks as needed. Avoid applying to face, groin, and axilla. Use as directed. Long-term use can cause thinning of the skin.   Continue Hydrocortisone 2.5% ointment twice daily to affected areas on face 1-2 weeks as needed.    Topical steroids (such as triamcinolone, fluocinolone, fluocinonide, mometasone, clobetasol, halobetasol, betamethasone, hydrocortisone) can cause thinning and lightening of the skin if they are used for too long in the same area. Your physician has selected the right strength medicine for your problem and area affected on the body. Please use your medication only as directed by your physician to prevent side effects.   Continue Dupixent injections every 2 weeks.   Dupilumab (Dupixent) is a treatment given by injection for adults and children with moderate-to-severe atopic dermatitis. Goal is control of skin condition, not cure. It is given as 2 injections at the first dose followed by 1 injection ever 2 weeks thereafter.  Young children are dosed monthly.  Potential side effects include allergic reaction, herpes infections, injection site reactions and conjunctivitis (inflammation of the eyes).  The use of Dupixent requires long term medication management, including periodic office visits.    Gentle Skin Care Guide  1. Bathe no more than once a day.  2. Avoid bathing in hot water  3. Use a mild soap like Dove, Vanicream, Cetaphil, CeraVe. Can use Lever 2000 or Cetaphil antibacterial soap  4. Use soap only where you need it. On most days, use it under your arms, between your legs, and on your feet. Let the water rinse other areas unless visibly dirty.  5. When you get out of the bath/shower, use a towel to gently blot your skin dry, don't rub it.  6. While your skin is still a little damp, apply a moisturizing cream such as Vanicream, CeraVe, Cetaphil, Eucerin, Sarna lotion or plain Vaseline  Jelly. For hands apply Neutrogena [place] Hand Cream or Excipial Hand Cream.  7. Reapply moisturizer any time you start to itch or feel dry.  8. Sometimes using free and clear laundry detergents can be helpful. Fabric softener sheets should be avoided. Downy Free & Gentle liquid, or any liquid fabric softener that is free of dyes and perfumes, it acceptable to use  9. If your doctor has given you prescription creams you may apply moisturizers over them      Due to recent changes in healthcare laws, you may see results of your pathology and/or laboratory studies on MyChart before the doctors have had a chance to review them. We understand that in some cases there may be results that are confusing or concerning to you. Please understand that not all results are received at the same time and often the doctors may need to interpret multiple results in order to provide you with the best plan of care or course of treatment. Therefore, we ask that you please give [place] 2 business days to thoroughly review all your results before contacting the office for clarification. Should we see a critical lab result, you will be contacted sooner.   If You Need Anything After Your Visit  If you have any questions or concerns for your doctor, please call our main line at [phone] and press option 4 to reach your doctor's medical assistant. If no one answers, please leave a voicemail as directed and we will return your call as soon as possible. Messages left after 4 pm will be answered the following business day.  You may also send [place] a message via MyChart. We typically respond to MyChart messages within 1-2 business days.  For prescription refills, please ask your pharmacy to contact our office. Our fax number is [phone].  If you have an urgent issue when the clinic is closed that cannot wait until the next business day, you can page your doctor at the number below.    Please note that while we do our  best to be available for urgent issues outside of office hours, we are not available 24/7.   If you have an urgent issue and are unable to reach [place], you may choose to seek medical care at your doctor's office, retail clinic, urgent care center, or emergency room.  If you have a medical emergency, please immediately call 911 or go to the emergency department.  Pager Numbers  - Dr. Gwen Pounds: [phone]  - Dr. Neale Burly: [phone]  - Dr. Roseanne Reno: [phone]  In the event of inclement weather, please call our main line at [phone] for an update on the status of any delays or closures.  Dermatology Medication Tips: Please keep the boxes that topical medications come in in order to help keep track of the instructions about where and how to use these. Pharmacies typically print the medication instructions only on the boxes and not directly on the medication tubes.   If your medication is too expensive, please contact our office at [phone] option 4 or send [place] a message through MyChart.   We are unable to tell what your co-pay for medications will be in advance as this is different depending on your insurance coverage. However, we may be able to find a substitute medication at lower cost or fill out paperwork to get insurance to cover a needed medication.   If a prior authorization is required to get your medication covered by your insurance company, please allow [place] 1-2 business days to complete this process.  Drug prices often vary depending on where the prescription is filled and some pharmacies may offer cheaper prices.  The website www.goodrx.com contains coupons for medications through different pharmacies. The prices here do not account for what the cost may be with help from insurance (it may be cheaper with your insurance), but the website can give you the price if you did not use any insurance.  - You can print the associated coupon and take it with your prescription to the  pharmacy.  - You may also stop by our office during regular business hours and pick up a GoodRx coupon card.  - If you need your prescription sent electronically to a different pharmacy, notify our office through [facility] or by phone at [phone] option 4.     Si Usted Necesita Algo Despus de Su Visita  Tambin puede enviarnos un mensaje a travs de Clinical cytogeneticist. Por lo general respondemos a los mensajes de MyChart en el transcurso de 1 a 2 das hbiles.  Para renovar recetas, por favor pida a su farmacia que se ponga en contacto con nuestra oficina. Annie Sable de fax es [place] [phone].  Si tiene un asunto urgente cuando la clnica est cerrada y que no puede esperar hasta el siguiente da hbil, puede llamar/localizar a su doctor(a) al nmero que aparece a continuacin.   Por favor, tenga en cuenta que aunque hacemos todo lo posible para estar disponibles para asuntos urgentes fuera del horario de [place], no estamos disponibles las 24 horas del da, los 7 [address]  [address] de la [place].  Si tiene un problema urgente y no puede comunicarse con nosotros, puede optar por buscar atencin mdica  en el consultorio de su doctor(a), en una clnica privada, en un centro de atencin urgente o en una sala de emergencias.  Si tiene Engineering geologist, por favor llame inmediatamente al 911 o vaya a la sala de emergencias.  Nmeros de bper  - Dr. Nehemiah Massed: [phone]  - Dra. Moye: [phone]  - Dra. Nicole Kindred: [phone]  En caso de inclemencias del [place], por favor llame a Johnsie Kindred principal al [phone] para una actualizacin sobre el [place] de cualquier retraso o cierre.  Consejos para la medicacin en dermatologa: Por favor, guarde las cajas en las que vienen los medicamentos de uso tpico para ayudarle a seguir las instrucciones sobre dnde y cmo usarlos. Las farmacias generalmente imprimen las instrucciones del medicamento slo en las cajas y no directamente en los  tubos del [place].   Si su medicamento es muy caro, por favor, pngase en contacto con Zigmund Daniel llamando al [phone] y presione la opcin 4 o envenos un mensaje a travs de Pharmacist, community.   No podemos decirle cul ser su copago por los medicamentos por adelantado ya que esto es diferente dependiendo de la cobertura de su seguro. Sin embargo, es posible que podamos encontrar un medicamento sustituto a Electrical engineer un formulario para que el seguro cubra el medicamento que se considera necesario.   Si se requiere una autorizacin previa para que su compaa de seguros [place] su medicamento, por favor permtanos de 1 a 2 das hbiles para completar este proceso.  Los precios de los medicamentos varan con frecuencia dependiendo del Environmental consultant de dnde se surte la receta y alguna farmacias pueden ofrecer precios ms baratos.  El sitio web www.goodrx.com tiene cupones para medicamentos de Airline pilot. Los precios aqu no tienen en cuenta lo que podra costar con la ayuda del seguro (puede ser ms barato con su seguro), pero el sitio web puede darle el precio si no utiliz Research scientist (physical sciences).  - Puede imprimir el cupn correspondiente y llevarlo con su receta a la farmacia.  - Tambin puede pasar por nuestra oficina durante el horario de atencin regular y Charity fundraiser una tarjeta de cupones de GoodRx.  - Si necesita que su receta se enve electrnicamente a una farmacia diferente, informe a nuestra oficina a travs de MyChart de [facility] o por telfono llamando al [phone] y presione la opcin 4.

## 2022-07-05 NOTE — Progress Notes (Signed)
   Follow-Up Visit   Subjective  Bradley Maynard is a 27 y.o. male who presents for the following: Atopic Dermatitis. Has not had a Dupixent injection since his last visit here 03/20/2022. He has not returned calls to [facility] for delivery of the medication. States he is unable to do them and will need to come here every 2 weeks. Needs refills of topical creams. HC ointment and Triamcinolone ointment.   The following portions of the chart were reviewed this encounter and updated as appropriate: medications, allergies, medical history  Review of Systems:  No other skin or systemic complaints except as noted in HPI or Assessment and Plan.  Objective  Well appearing patient in no apparent distress; mood and affect are within normal limits.  Areas Examined: Arms, face  Relevant physical exam findings are noted in the Assessment and Plan.    Assessment & Plan    ATOPIC DERMATITIS Exam:  Scaly hyperpigmented papules coalescing to plaques with lichenification   >20% BSA  Chronic and persistent condition with duration or expected duration over one year. Condition is bothersome/symptomatic for patient. Currently flared.  Atopic dermatitis (eczema) is a chronic, relapsing, pruritic condition that can significantly affect quality of life. It is often associated with allergic rhinitis and/or asthma and can require treatment with topical medications, phototherapy, or in severe cases biologic injectable medication (Dupixent; Adbry) or Oral JAK inhibitors.  Treatment Plan: Continue Triamcinolone 0.1% ointment twice daily to affected body areas up to 2 weeks as needed. Avoid applying to face, groin, and axilla. Use as directed. Long-term use can cause thinning of the skin.   Continue Hydrocortisone 2.5% ointment twice daily to affected areas on face 1-2 weeks as needed.    Topical steroids (such as triamcinolone, fluocinolone, fluocinonide, mometasone, clobetasol, halobetasol,  betamethasone, hydrocortisone) can cause thinning and lightening of the skin if they are used for too long in the same area. Your physician has selected the right strength medicine for your problem and area affected on the body. Please use your medication only as directed by your physician to prevent side effects.   Discussed oral vs injectable treatments. Patient defers oral medication.  Patient prefers to continue with Dupixent. He will call the pharmacy and schedule delivery to our office and RTC every 2 weeks for injections.  Dupixent 300 mg injected in to B/L upper arms today. Patient tolerated well.   ZOX:0960-4540-98 Lot: [id number] Exp: 03/21/2024  Recommend gentle skin care.  Encounter for Long-Term Medication Management  Return in about 2 weeks (around 07/19/2022) for Dupixent Injection On Nurse Schedule, Atopic Dermatitis Follow Up in 3 months.  I, Lawson Radar, CMA, am acting as scribe for Darden Dates, MD.   Documentation: I have reviewed the above documentation for accuracy and completeness, and I agree with the above.  Darden Dates, MD

## 2022-07-19 ENCOUNTER — Ambulatory Visit (INDEPENDENT_AMBULATORY_CARE_PROVIDER_SITE_OTHER): Payer: Self-pay

## 2022-07-19 DIAGNOSIS — L209 Atopic dermatitis, unspecified: Secondary | ICD-10-CM

## 2022-07-19 MED ORDER — DUPILUMAB 300 MG/2ML ~~LOC~~ SOSY
300.0000 mg | PREFILLED_SYRINGE | Freq: Once | SUBCUTANEOUS | Status: AC
  Administered 2022-07-19: 300 mg via SUBCUTANEOUS

## 2022-07-19 NOTE — Progress Notes (Addendum)
Patient here today for two week Dupixent injection for severe atopic dermatitis.   Dupixent 300mg /59mL injected into right arm. Patient tolerated well.  LOTL [id number] EXP: 07/2024  Dorathy Daft, RMA

## 2022-08-02 ENCOUNTER — Ambulatory Visit (INDEPENDENT_AMBULATORY_CARE_PROVIDER_SITE_OTHER): Payer: Self-pay

## 2022-08-02 DIAGNOSIS — L209 Atopic dermatitis, unspecified: Secondary | ICD-10-CM

## 2022-08-02 MED ORDER — DUPILUMAB 300 MG/2ML ~~LOC~~ SOAJ
300.0000 mg | Freq: Once | SUBCUTANEOUS | Status: AC
  Administered 2022-08-02: 300 mg via SUBCUTANEOUS

## 2022-08-02 NOTE — Progress Notes (Signed)
Patient here today for two week Dupixent injection for severe atopic dermatitis.    Dupixent 300mg /43mL pen injected into right arm. Patient tolerated well.   NDC: 4540-9811-91 LOTL [id number] EXP: 05/19/2024   Dorathy Daft, RMA

## 2022-08-16 ENCOUNTER — Ambulatory Visit: Payer: Self-pay
